# Patient Record
Sex: Female | Born: 1975 | Race: Black or African American | Hispanic: No | Marital: Married | Smoking: Never smoker
Health system: Southern US, Community
[De-identification: ages and names within clinical notes are randomized; demographics above are authoritative.]

## PROBLEM LIST (undated history)

## (undated) ENCOUNTER — Inpatient Hospital Stay (HOSPITAL_COMMUNITY): Payer: Self-pay

## (undated) DIAGNOSIS — Z789 Other specified health status: Secondary | ICD-10-CM

## (undated) DIAGNOSIS — Z289 Immunization not carried out for unspecified reason: Secondary | ICD-10-CM

## (undated) HISTORY — PX: WISDOM TOOTH EXTRACTION: SHX21

---
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
## 2009-05-05 ENCOUNTER — Inpatient Hospital Stay (HOSPITAL_COMMUNITY): Admission: AD | Admit: 2009-05-05 | Discharge: 2009-05-05 | Payer: Self-pay | Admitting: Obstetrics and Gynecology

## 2009-05-08 ENCOUNTER — Inpatient Hospital Stay (HOSPITAL_COMMUNITY): Admission: AD | Admit: 2009-05-08 | Discharge: 2009-05-11 | Payer: Self-pay | Admitting: Obstetrics and Gynecology

## 2009-05-08 ENCOUNTER — Ambulatory Visit: Payer: Self-pay | Admitting: Advanced Practice Midwife

## 2009-05-12 ENCOUNTER — Ambulatory Visit: Admission: RE | Admit: 2009-05-12 | Discharge: 2009-05-12 | Payer: Self-pay | Admitting: Obstetrics and Gynecology

## 2009-05-13 ENCOUNTER — Ambulatory Visit: Admission: RE | Admit: 2009-05-13 | Discharge: 2009-05-13 | Payer: Self-pay | Admitting: Obstetrics and Gynecology

## 2011-03-28 LAB — CBC
HCT: 23.4 % — ABNORMAL LOW (ref 36.0–46.0)
HCT: 33.3 % — ABNORMAL LOW (ref 36.0–46.0)
Hemoglobin: 8.2 g/dL — ABNORMAL LOW (ref 12.0–15.0)
MCHC: 34.9 g/dL (ref 30.0–36.0)
MCV: 98.1 fL (ref 78.0–100.0)
MCV: 99 fL (ref 78.0–100.0)
RBC: 2.37 MIL/uL — ABNORMAL LOW (ref 3.87–5.11)
RBC: 3.4 MIL/uL — ABNORMAL LOW (ref 3.87–5.11)
WBC: 8.3 10*3/uL (ref 4.0–10.5)

## 2011-03-31 ENCOUNTER — Inpatient Hospital Stay (HOSPITAL_COMMUNITY): Admission: AD | Admit: 2011-03-31 | Payer: Self-pay | Source: Home / Self Care | Admitting: Obstetrics and Gynecology

## 2011-04-06 ENCOUNTER — Inpatient Hospital Stay (HOSPITAL_COMMUNITY)
Admission: RE | Admit: 2011-04-06 | Discharge: 2011-04-08 | DRG: 373 | Disposition: A | Discharge status: Home / Self Care | Payer: BC Managed Care – PPO | Source: Ambulatory Visit | Attending: Obstetrics and Gynecology | Admitting: Obstetrics and Gynecology

## 2011-04-06 LAB — CBC
MCH: 32.4 pg (ref 26.0–34.0)
MCV: 95.5 fL (ref 78.0–100.0)
Platelets: 164 10*3/uL (ref 150–400)
RDW: 13.3 % (ref 11.5–15.5)

## 2011-04-07 LAB — CBC
Hemoglobin: 11 g/dL — ABNORMAL LOW (ref 12.0–15.0)
MCH: 32.5 pg (ref 26.0–34.0)
MCHC: 34.6 g/dL (ref 30.0–36.0)
Platelets: 143 10*3/uL — ABNORMAL LOW (ref 150–400)

## 2011-06-11 NOTE — H&P (Signed)
  NAME:  Jasmine Bowen, Jasmine Bowen                ACCOUNT NO.:  [id number]  MEDICAL RECORD NO.:  [id number]  LOCATION:  9121                          FACILITY:  [facility]  PHYSICIAN:  Kimika Streater L. Media Pizzini, M.D.DATE OF BIRTH:  11-25-1976  DATE OF ADMISSION:  04/06/2011 DATE OF DISCHARGE:  04/08/2011                             HISTORY & PHYSICAL   HISTORY OF PRESENT ILLNESS:  This patient is a 35 year old G2, P1 at 20 and 6, who was admitted for post-date induction per Dr. Renaldo Fiddler.  She has an uncomplicated prenatal history.  Prenatal care __________ Catha Gosselin.  PHYSICAL EXAMINATION:  VITAL SIGNS:  She is afebrile with stable vital signs.  Fetal heart rate is reactive and reassuring. GENERAL:  Alert and oriented, no apparent distress. LUNGS:  Clear to auscultation bilaterally. CARDIAC:  Regular rate and rhythm. BREASTS:  Soft, nontender, no masses. __________ She is about 7-1/2 pounds. LUNGS AND HEART:  Within normal limits. CERVIX:  See nursing notes because I cannot find it in the chart.  IMPRESSION:  Intrauterine pregnancy at 74 and 6, post dates.  PLAN:  Admit for induction per Dr. Renaldo Fiddler.     Callaway Hailes L. Vincente Poli, M.D.     Florestine Avers  D:  06/09/2011  T:  06/10/2011  Job:  [id number]  Electronically Signed by Marcelle Overlie M.D. on 06/11/2011 08:46:16 AM

## 2013-08-01 LAB — OB RESULTS CONSOLE ABO/RH: RH TYPE: POSITIVE

## 2013-08-01 LAB — OB RESULTS CONSOLE GC/CHLAMYDIA
CHLAMYDIA, DNA PROBE: NEGATIVE
GC PROBE AMP, GENITAL: NEGATIVE

## 2013-08-01 LAB — OB RESULTS CONSOLE RPR: RPR: NONREACTIVE

## 2013-08-01 LAB — OB RESULTS CONSOLE HEPATITIS B SURFACE ANTIGEN: HEP B S AG: NEGATIVE

## 2013-08-01 LAB — OB RESULTS CONSOLE ANTIBODY SCREEN: Antibody Screen: NEGATIVE

## 2013-08-01 LAB — OB RESULTS CONSOLE RUBELLA ANTIBODY, IGM: Rubella: IMMUNE

## 2013-12-18 NOTE — L&D Delivery Note (Signed)
Delivery Note At 11:24 AM a viable female was delivered via Vaginal, Spontaneous Delivery (Presentation: Left Occiput Anterior).  APGAR: 8, 9; weight .   Placenta status: Intact, Spontaneous.  Cord: 3 vessels with the following complications: .  Cord pH: not sent  Anesthesia: Epidural  Episiotomy: None Lacerations: 2nd degree Suture Repair: 3.0 vicryl rapide Est. Blood Loss (mL): 300  Mom to postpartum.  Baby to Nursery.  Lizbet Cirrincione M 03/13/2014, 12:01 PM

## 2013-12-29 ENCOUNTER — Inpatient Hospital Stay (HOSPITAL_COMMUNITY)
Admission: AD | Admit: 2013-12-29 | Discharge: 2013-12-29 | Disposition: A | Discharge status: Home / Self Care | Payer: BC Managed Care – PPO | Source: Ambulatory Visit | Attending: Obstetrics and Gynecology | Admitting: Obstetrics and Gynecology

## 2013-12-29 ENCOUNTER — Encounter (HOSPITAL_COMMUNITY): Payer: Self-pay | Admitting: *Deleted

## 2013-12-29 DIAGNOSIS — Z3689 Encounter for other specified antenatal screening: Secondary | ICD-10-CM

## 2013-12-29 DIAGNOSIS — O368390 Maternal care for abnormalities of the fetal heart rate or rhythm, unspecified trimester, not applicable or unspecified: Secondary | ICD-10-CM | POA: Insufficient documentation

## 2013-12-29 HISTORY — DX: Other specified health status: Z78.9

## 2013-12-29 NOTE — MAU Provider Note (Signed)
  History     CSN: [id number]  Arrival date and time: 12/29/13 1656   First Provider Initiated Contact with Patient 12/29/13 1823      Chief Complaint  Patient presents with  . fetal monitoring    HPI  Ms. Jasmine Bowen is a 38 y.o. female G3P2002 at 7348w5d who presents for an NST. She was seen at her prenatal visit today and they thought they heard an arrythmia on fetal doppler and sent her here for further monitoring. She reports good fetal movement, denies LOF, vaginal bleeding, vaginal itching/burning, urinary symptoms, h/a, dizziness, n/v, or fever/chills.    OB History   Grav Para Term Preterm Abortions TAB SAB Ect Mult Living   3 2 2       2       Past Medical History  Diagnosis Date  . Medical history non-contributory     Past Surgical History  Procedure Laterality Date  . Wisdom tooth extraction      History reviewed. No pertinent family history.  History  Substance Use Topics  . Smoking status: Never Smoker   . Smokeless tobacco: Not on file  . Alcohol Use: No    Allergies: No Known Allergies  Prescriptions prior to admission  Medication Sig Dispense Refill  . Fe Fum-FePoly-FA-Vit C-Vit B3 (INTEGRA F) 125-1 MG CAPS Take 1 tablet by mouth at bedtime.      . Prenatal Vit-Fe Fumarate-FA (PRENATAL MULTIVITAMIN) TABS tablet Take 1 tablet by mouth daily at 12 noon.        Review of Systems  Constitutional: Negative for fever and chills.  Gastrointestinal: Negative for heartburn, nausea, vomiting, abdominal pain, diarrhea and constipation.  Genitourinary: Negative for dysuria, urgency, frequency and hematuria.       No vaginal discharge. No vaginal bleeding. No dysuria.    Physical Exam   Blood pressure 115/63, pulse 93, temperature 98.5 F (36.9 C), temperature source Oral, resp. rate 18, height 5\' 7"  (1.702 m), weight 69.854 kg (154 lb).  Physical Exam  Constitutional: She is oriented to person, place, and time. She appears well-developed and  well-nourished. No distress.  HENT:  Head: Normocephalic.  Eyes: Conjunctivae are normal.  Neck: Neck supple.  Musculoskeletal: Normal range of motion.  Neurological: She is alert and oriented to person, place, and time.  Skin: Skin is warm. She is not diaphoretic.  Psychiatric: Her behavior is normal.    Fetal Tracing: Baseline: 140 bpm with prolonged acceleration  Variability: moderate  Accelerations: 15x15 Decelerations: none Toco: None  MAU Course  Procedures None  MDM NST  Assessment and Plan   A:  1. NST (non-stress test) reactive on fetal surveillance     P:  Discharge home Kick counts discussed The office will call you this week to schedule a follow up appt.  Return to MAU for further concern   Iona HansenJennifer Irene Rasch, NP 12/29/2013, 7:18 PM

## 2013-12-29 NOTE — MAU Note (Signed)
Pt sent from MD office for monitoring, ? Arrythmia heard with doppler.  Pt denies pain, bleeding or LOF.

## 2014-03-12 ENCOUNTER — Inpatient Hospital Stay (HOSPITAL_COMMUNITY)
Admission: AD | Admit: 2014-03-12 | Discharge: 2014-03-15 | DRG: 775 | Disposition: A | Discharge status: Home / Self Care | Payer: BC Managed Care – PPO | Source: Ambulatory Visit | Attending: Obstetrics and Gynecology | Admitting: Obstetrics and Gynecology

## 2014-03-12 ENCOUNTER — Encounter (HOSPITAL_COMMUNITY): Payer: Self-pay | Admitting: *Deleted

## 2014-03-12 DIAGNOSIS — O99892 Other specified diseases and conditions complicating childbirth: Secondary | ICD-10-CM | POA: Diagnosis present

## 2014-03-12 DIAGNOSIS — O9989 Other specified diseases and conditions complicating pregnancy, childbirth and the puerperium: Secondary | ICD-10-CM

## 2014-03-12 DIAGNOSIS — IMO0001 Reserved for inherently not codable concepts without codable children: Secondary | ICD-10-CM

## 2014-03-12 DIAGNOSIS — Z22330 Carrier of Group B streptococcus: Secondary | ICD-10-CM

## 2014-03-12 DIAGNOSIS — O09529 Supervision of elderly multigravida, unspecified trimester: Secondary | ICD-10-CM | POA: Diagnosis present

## 2014-03-12 LAB — CBC
HCT: 33.3 % — ABNORMAL LOW (ref 36.0–46.0)
HEMOGLOBIN: 12 g/dL (ref 12.0–15.0)
MCH: 33.1 pg (ref 26.0–34.0)
MCHC: 36 g/dL (ref 30.0–36.0)
MCV: 92 fL (ref 78.0–100.0)
Platelets: 157 10*3/uL (ref 150–400)
RBC: 3.62 MIL/uL — AB (ref 3.87–5.11)
RDW: 13.1 % (ref 11.5–15.5)
WBC: 6.5 10*3/uL (ref 4.0–10.5)

## 2014-03-12 LAB — OB RESULTS CONSOLE GBS: GBS: POSITIVE

## 2014-03-12 LAB — OB RESULTS CONSOLE HIV ANTIBODY (ROUTINE TESTING): HIV: NONREACTIVE

## 2014-03-12 LAB — RAPID HIV SCREEN (WH-MAU): Rapid HIV Screen: NONREACTIVE

## 2014-03-12 MED ORDER — LIDOCAINE HCL (PF) 1 % IJ SOLN
30.0000 mL | INTRAMUSCULAR | Status: DC | PRN
  Filled 2014-03-12: qty 30

## 2014-03-12 MED ORDER — PENICILLIN G POTASSIUM 5000000 UNITS IJ SOLR
2.5000 10*6.[IU] | INTRAVENOUS | Status: DC
  Administered 2014-03-13 (×2): 2.5 10*6.[IU] via INTRAVENOUS
  Filled 2014-03-12 (×7): qty 2.5

## 2014-03-12 MED ORDER — LACTATED RINGERS IV SOLN
INTRAVENOUS | Status: DC
  Administered 2014-03-12 – 2014-03-13 (×3): via INTRAVENOUS

## 2014-03-12 MED ORDER — FENTANYL 2.5 MCG/ML BUPIVACAINE 1/10 % EPIDURAL INFUSION (WH - ANES)
14.0000 mL/h | INTRAMUSCULAR | Status: DC | PRN
  Administered 2014-03-13 (×2): 14 mL/h via EPIDURAL
  Filled 2014-03-12 (×2): qty 125

## 2014-03-12 MED ORDER — OXYTOCIN 40 UNITS IN LACTATED RINGERS INFUSION - SIMPLE MED
62.5000 mL/h | INTRAVENOUS | Status: DC
  Filled 2014-03-12: qty 1000

## 2014-03-12 MED ORDER — IBUPROFEN 600 MG PO TABS: 600.0000 mg | ORAL_TABLET | Freq: Four times a day (QID) | ORAL | Status: DC | PRN

## 2014-03-12 MED ORDER — ONDANSETRON HCL 4 MG/2ML IJ SOLN: 4.0000 mg | Freq: Four times a day (QID) | INTRAMUSCULAR | Status: DC | PRN

## 2014-03-12 MED ORDER — PHENYLEPHRINE 40 MCG/ML (10ML) SYRINGE FOR IV PUSH (FOR BLOOD PRESSURE SUPPORT)
80.0000 ug | PREFILLED_SYRINGE | INTRAVENOUS | Status: DC | PRN
  Filled 2014-03-12: qty 10
  Filled 2014-03-12: qty 2

## 2014-03-12 MED ORDER — EPHEDRINE 5 MG/ML INJ
10.0000 mg | INTRAVENOUS | Status: DC | PRN
  Filled 2014-03-12: qty 2
  Filled 2014-03-12: qty 4

## 2014-03-12 MED ORDER — OXYCODONE-ACETAMINOPHEN 5-325 MG PO TABS: 1.0000 | ORAL_TABLET | ORAL | Status: DC | PRN

## 2014-03-12 MED ORDER — OXYTOCIN BOLUS FROM INFUSION
500.0000 mL | INTRAVENOUS | Status: DC
  Administered 2014-03-13: 500 mL via INTRAVENOUS

## 2014-03-12 MED ORDER — DIPHENHYDRAMINE HCL 50 MG/ML IJ SOLN: 12.5000 mg | INTRAMUSCULAR | Status: DC | PRN

## 2014-03-12 MED ORDER — LACTATED RINGERS IV SOLN: 500.0000 mL | Freq: Once | INTRAVENOUS | Status: DC

## 2014-03-12 MED ORDER — ACETAMINOPHEN 325 MG PO TABS: 650.0000 mg | ORAL_TABLET | ORAL | Status: DC | PRN

## 2014-03-12 MED ORDER — EPHEDRINE 5 MG/ML INJ
10.0000 mg | INTRAVENOUS | Status: DC | PRN
  Filled 2014-03-12: qty 2

## 2014-03-12 MED ORDER — PENICILLIN G POTASSIUM 5000000 UNITS IJ SOLR
5.0000 10*6.[IU] | Freq: Once | INTRAVENOUS | Status: AC
  Administered 2014-03-12: 5 10*6.[IU] via INTRAVENOUS
  Filled 2014-03-12: qty 5

## 2014-03-12 MED ORDER — PHENYLEPHRINE 40 MCG/ML (10ML) SYRINGE FOR IV PUSH (FOR BLOOD PRESSURE SUPPORT)
80.0000 ug | PREFILLED_SYRINGE | INTRAVENOUS | Status: DC | PRN
  Filled 2014-03-12: qty 2

## 2014-03-12 MED ORDER — CITRIC ACID-SODIUM CITRATE 334-500 MG/5ML PO SOLN: 30.0000 mL | ORAL | Status: DC | PRN

## 2014-03-12 MED ORDER — FLEET ENEMA 7-19 GM/118ML RE ENEM: 1.0000 | ENEMA | RECTAL | Status: DC | PRN

## 2014-03-12 MED ORDER — LACTATED RINGERS IV SOLN: 500.0000 mL | INTRAVENOUS | Status: DC | PRN

## 2014-03-12 NOTE — MAU Note (Signed)
Contractions every 5 minutes. Denies VB or LOF. Positive fetal movement. Was dilated 3-4 cm in office.

## 2014-03-13 ENCOUNTER — Encounter (HOSPITAL_COMMUNITY): Payer: Self-pay | Admitting: *Deleted

## 2014-03-13 ENCOUNTER — Encounter (HOSPITAL_COMMUNITY): Payer: BC Managed Care – PPO | Admitting: Anesthesiology

## 2014-03-13 ENCOUNTER — Inpatient Hospital Stay (HOSPITAL_COMMUNITY): Payer: BC Managed Care – PPO | Admitting: Anesthesiology

## 2014-03-13 LAB — RPR: RPR Ser Ql: NONREACTIVE

## 2014-03-13 MED ORDER — BENZOCAINE-MENTHOL 20-0.5 % EX AERO
1.0000 "application " | INHALATION_SPRAY | CUTANEOUS | Status: DC | PRN
  Administered 2014-03-13: 1 via TOPICAL
  Filled 2014-03-13: qty 56

## 2014-03-13 MED ORDER — BISACODYL 10 MG RE SUPP: 10.0000 mg | Freq: Every day | RECTAL | Status: DC | PRN

## 2014-03-13 MED ORDER — DIPHENHYDRAMINE HCL 25 MG PO CAPS: 25.0000 mg | ORAL_CAPSULE | Freq: Four times a day (QID) | ORAL | Status: DC | PRN

## 2014-03-13 MED ORDER — IBUPROFEN 800 MG PO TABS
800.0000 mg | ORAL_TABLET | Freq: Three times a day (TID) | ORAL | Status: DC | PRN
  Administered 2014-03-13 – 2014-03-15 (×5): 800 mg via ORAL
  Filled 2014-03-13 (×5): qty 1

## 2014-03-13 MED ORDER — OXYCODONE-ACETAMINOPHEN 5-325 MG PO TABS: 1.0000 | ORAL_TABLET | Freq: Four times a day (QID) | ORAL | Status: DC | PRN

## 2014-03-13 MED ORDER — ONDANSETRON HCL 4 MG/2ML IJ SOLN: 4.0000 mg | INTRAMUSCULAR | Status: DC | PRN

## 2014-03-13 MED ORDER — ZOLPIDEM TARTRATE 5 MG PO TABS: 5.0000 mg | ORAL_TABLET | Freq: Every evening | ORAL | Status: DC | PRN

## 2014-03-13 MED ORDER — LIDOCAINE HCL (PF) 1 % IJ SOLN
INTRAMUSCULAR | Status: DC | PRN
  Administered 2014-03-13 (×4): 4 mL

## 2014-03-13 MED ORDER — TETANUS-DIPHTH-ACELL PERTUSSIS 5-2.5-18.5 LF-MCG/0.5 IM SUSP: 0.5000 mL | Freq: Once | INTRAMUSCULAR | Status: DC

## 2014-03-13 MED ORDER — FLEET ENEMA 7-19 GM/118ML RE ENEM: 1.0000 | ENEMA | Freq: Every day | RECTAL | Status: DC | PRN

## 2014-03-13 MED ORDER — ONDANSETRON HCL 4 MG PO TABS: 4.0000 mg | ORAL_TABLET | ORAL | Status: DC | PRN

## 2014-03-13 MED ORDER — LANOLIN HYDROUS EX OINT: TOPICAL_OINTMENT | CUTANEOUS | Status: DC | PRN

## 2014-03-13 MED ORDER — WITCH HAZEL-GLYCERIN EX PADS: 1.0000 "application " | MEDICATED_PAD | CUTANEOUS | Status: DC | PRN

## 2014-03-13 MED ORDER — MEASLES, MUMPS & RUBELLA VAC ~~LOC~~ INJ: 0.5000 mL | INJECTION | Freq: Once | SUBCUTANEOUS | Status: DC

## 2014-03-13 MED ORDER — DIBUCAINE 1 % RE OINT: 1.0000 "application " | TOPICAL_OINTMENT | RECTAL | Status: DC | PRN

## 2014-03-13 MED ORDER — PRENATAL MULTIVITAMIN CH
1.0000 | ORAL_TABLET | Freq: Every day | ORAL | Status: DC
  Administered 2014-03-13 – 2014-03-14 (×2): 1 via ORAL
  Filled 2014-03-13 (×2): qty 1

## 2014-03-13 MED ORDER — SENNOSIDES-DOCUSATE SODIUM 8.6-50 MG PO TABS
2.0000 | ORAL_TABLET | ORAL | Status: DC
  Administered 2014-03-14 (×2): 2 via ORAL
  Filled 2014-03-13 (×2): qty 2

## 2014-03-13 MED ORDER — SIMETHICONE 80 MG PO CHEW: 80.0000 mg | CHEWABLE_TABLET | ORAL | Status: DC | PRN

## 2014-03-13 NOTE — Anesthesia Postprocedure Evaluation (Signed)
  Anesthesia Post-op Note  Patient: Jasmine RackJerilyn S Merkel  Procedure(s) Performed: * No procedures listed *  Patient Location: mother baby  Anesthesia Type:Epidural  Level of Consciousness: awake, alert  and oriented  Airway and Oxygen Therapy: Patient Spontanous Breathing  Post-op Pain: none  Post-op Assessment: Post-op Vital signs reviewed, Patient's Cardiovascular Status Stable, Respiratory Function Stable, No headache, No backache, No residual numbness and No residual motor weakness  Post-op Vital Signs: Reviewed and stable  Complications: No apparent anesthesia complications

## 2014-03-13 NOTE — H&P (Signed)
Jasmine Bowen is a 38 y.o. female presenting at 6740.2 with onset of labor.  AMA declined screening.  postive GBS Maternal Medical History:  Reason for admission: Contractions.   Contractions: Onset was 1-2 hours ago.   Frequency: regular.   Perceived severity is moderate.    Fetal activity: Perceived fetal activity is normal.    Prenatal complications: no prenatal complications Prenatal Complications - Diabetes: none.    OB History   Grav Para Term Preterm Abortions TAB SAB Ect Mult Living   3 2 2       2      Past Medical History  Diagnosis Date  . Medical history non-contributory    Past Surgical History  Procedure Laterality Date  . Wisdom tooth extraction     Family History: family history is not on file. Social History:  reports that she has never smoked. She does not have any smokeless tobacco history on file. She reports that she does not drink alcohol or use illicit drugs.   Prenatal Transfer Tool  Maternal Diabetes: No Genetic Screening: Declined Maternal Ultrasounds/Referrals: Normal Fetal Ultrasounds or other Referrals:  None Maternal Substance Abuse:  No Significant Maternal Medications:  None Significant Maternal Lab Results:  Lab values include: Group B Strep positive Other Comments:  None  ROS  Dilation: 7 Effacement (%): 60;70 Station: 0 Exam by:: LCarpenter,RN Blood pressure 109/81, pulse 80, temperature 98.6 F (37 C), temperature source Oral, resp. rate 18, height 5\' 7"  (1.702 m), weight 73.211 kg (161 lb 6.4 oz), SpO2 99.00%. Maternal Exam:  Uterine Assessment: Contraction strength is moderate.  Contraction frequency is regular.   Abdomen: Patient reports no abdominal tenderness. Fundal height is c/w dates.   Estimated fetal weight is 7.   Fetal presentation: vertex  Introitus: Amniotic fluid character: clear.  Pelvis: adequate for delivery.   Cervix: 7 cm and 100 %   Fetal Exam Fetal State Assessment: Category I - tracings are  normal.     Physical Exam  Prenatal labs: ABO, Rh: B/Positive/-- (08/15 0000) Antibody: Negative (08/15 0000) Rubella: Immune (08/15 0000) RPR: NON REACTIVE (03/26 2300)  HBsAg: Negative (08/15 0000)  HIV: Non-reactive (03/26 0000)  GBS: Positive (03/26 0000)   Assessment/Plan: IUP at 40.2 with sol Routine labor and delivery Penicillin for GBS   Jasmine Bowen 03/13/2014, 6:25 AM

## 2014-03-13 NOTE — Lactation Note (Signed)
This note was copied from the chart of Jasmine Lucinda Stopper. Lactation Consultation Note Initial visit at 12 hours of age.  Mom is finishing a feeding with a great latch.  Mom is experienced and denies problems or concerns.  [facility] given and discussed.  Baby has latched several times and mom is confident.  Follow up PRN.  Patient Name: Jasmine Bowen ZOXWR'UToday's Date: 03/13/2014 Reason for consult: Initial assessment   Maternal Data Has patient been taught Hand Expression?: Yes Does the patient have breastfeeding experience prior to this delivery?: Yes  Feeding Feeding Type: Breast Fed Length of feed:  (few minutes observed)  LATCH Score/Interventions Latch: Grasps breast easily, tongue down, lips flanged, rhythmical sucking.  Audible Swallowing: A few with stimulation (per mom)  Type of Nipple: Everted at rest and after stimulation  Comfort (Breast/Nipple): Soft / non-tender     Hold (Positioning): No assistance needed to correctly position infant at breast.  LATCH Score: 9  Lactation Tools Discussed/Used     Consult Status Consult Status: Follow-up Date: 03/14/14 Follow-up type: In-patient    Beverely RisenShoptaw, Arvella MerlesJana Lynn 03/13/2014, 11:36 PM

## 2014-03-13 NOTE — Anesthesia Preprocedure Evaluation (Signed)

## 2014-03-13 NOTE — Anesthesia Procedure Notes (Signed)
Epidural Patient location during procedure: OB Start time: 03/13/2014 12:23 AM  Staffing Performed by: anesthesiologist   Preanesthetic Checklist Completed: patient identified, site marked, surgical consent, pre-op evaluation, timeout performed, IV checked, risks and benefits discussed and monitors and equipment checked  Epidural Patient position: sitting Prep: site prepped and draped and DuraPrep Patient monitoring: continuous pulse ox and blood pressure Approach: midline Injection technique: LOR air  Needle:  Needle type: Tuohy  Needle gauge: 17 G Needle length: 9 cm and 9 Needle insertion depth: 5 cm cm Catheter type: closed end flexible Catheter size: 19 Gauge Catheter at skin depth: 10 cm Test dose: negative  Assessment Events: blood not aspirated, injection not painful, no injection resistance, negative IV test and no paresthesia  Additional Notes Discussed risk of headache, infection, bleeding, nerve injury and failed or incomplete block.  Patient voices understanding and wishes to proceed.  Epidural placed easily on first attempt.  No paresthesia.  Patient tolerated procedure well with no apparent complications.  Jasmine DecemberA. Calab Sachse, MDReason for block:procedure for pain

## 2014-03-14 LAB — CBC
HEMATOCRIT: 26.2 % — AB (ref 36.0–46.0)
HEMOGLOBIN: 9.4 g/dL — AB (ref 12.0–15.0)
MCH: 32.9 pg (ref 26.0–34.0)
MCHC: 35.5 g/dL (ref 30.0–36.0)
MCV: 92.6 fL (ref 78.0–100.0)
Platelets: 131 10*3/uL — ABNORMAL LOW (ref 150–400)
RBC: 2.83 MIL/uL — ABNORMAL LOW (ref 3.87–5.11)
RDW: 13.2 % (ref 11.5–15.5)
WBC: 8.2 10*3/uL (ref 4.0–10.5)

## 2014-03-14 NOTE — Progress Notes (Signed)
Post Partum Day 1 Subjective: no complaints  Objective: Blood pressure 126/67, pulse 77, temperature 98.1 F (36.7 C), temperature source Oral, resp. rate 20, height 5\' 7"  (1.702 m), weight 161 lb 6.4 oz (73.211 kg), SpO2 99.00%, unknown if currently breastfeeding.  Physical Exam:  General: alert Lochia: appropriate Uterine Fundus: firm Incision: healing well DVT Evaluation: No evidence of DVT seen on physical exam.   Recent Labs  03/12/14 2300 03/14/14 0547  HGB 12.0 9.4*  HCT 33.3* 26.2*    Assessment/Plan: Plan for discharge tomorrow   LOS: 2 days   Airiana Elman M 03/14/2014, 7:35 AM

## 2014-03-15 MED ORDER — IBUPROFEN 800 MG PO TABS: 800.0000 mg | ORAL_TABLET | Freq: Three times a day (TID) | ORAL | Status: DC | PRN

## 2014-03-15 NOTE — Discharge Summary (Signed)
Obstetric Discharge Summary Reason for Admission: onset of labor Prenatal Procedures: none Intrapartum Procedures: spontaneous vaginal delivery Postpartum Procedures: none Complications-Operative and Postpartum: none Hemoglobin  Date Value Ref Range Status  03/14/2014 9.4* 12.0 - 15.0 g/dL Final     REPEATED TO VERIFY     DELTA CHECK NOTED     HCT  Date Value Ref Range Status  03/14/2014 26.2* 36.0 - 46.0 % Final    Physical Exam:  General: alert Lochia: appropriate Uterine Fundus: firm Incision: healing well DVT Evaluation: No evidence of DVT seen on physical exam.  Discharge Diagnoses: Term Pregnancy-delivered  Discharge Information: Date: 03/15/2014 Activity: pelvic rest Diet: routine Medications: PNV and Ibuprofen Condition: stable Instructions: refer to practice specific booklet Discharge to: home Follow-up Information   [facility] Schedule an appointment as soon as possible for a visit in 6 weeks.   Contact information:   [address] [place] [id number] [phone]      Newborn Data: Live born female  Birth Weight: 8 lb 3.4 oz (3725 g) APGAR: 8, 9  Home with mother.  Meriel PicaHOLLAND,Abhi Moccia M 03/15/2014, 8:34 AM

## 2014-03-18 ENCOUNTER — Inpatient Hospital Stay (HOSPITAL_COMMUNITY): Admission: RE | Admit: 2014-03-18 | Payer: BC Managed Care – PPO | Source: Ambulatory Visit

## 2014-10-19 ENCOUNTER — Encounter (HOSPITAL_COMMUNITY): Payer: Self-pay | Admitting: *Deleted

## 2016-06-14 ENCOUNTER — Ambulatory Visit (HOSPITAL_COMMUNITY): Payer: BC Managed Care – PPO

## 2016-06-14 ENCOUNTER — Ambulatory Visit (HOSPITAL_COMMUNITY)
Admission: EM | Admit: 2016-06-14 | Discharge: 2016-06-14 | Disposition: A | Discharge status: Home / Self Care | Payer: BC Managed Care – PPO | Attending: Family Medicine | Admitting: Family Medicine

## 2016-06-14 ENCOUNTER — Encounter (HOSPITAL_COMMUNITY): Payer: Self-pay | Admitting: Emergency Medicine

## 2016-06-14 DIAGNOSIS — S62231A Other displaced fracture of base of first metacarpal bone, right hand, initial encounter for closed fracture: Secondary | ICD-10-CM

## 2016-06-14 DIAGNOSIS — S61011A Laceration without foreign body of right thumb without damage to nail, initial encounter: Secondary | ICD-10-CM | POA: Diagnosis not present

## 2016-06-14 DIAGNOSIS — W230XXA Caught, crushed, jammed, or pinched between moving objects, initial encounter: Secondary | ICD-10-CM | POA: Insufficient documentation

## 2016-06-14 DIAGNOSIS — S60011A Contusion of right thumb without damage to nail, initial encounter: Secondary | ICD-10-CM | POA: Diagnosis not present

## 2016-06-14 DIAGNOSIS — Z9889 Other specified postprocedural states: Secondary | ICD-10-CM | POA: Insufficient documentation

## 2016-06-14 DIAGNOSIS — S62501A Fracture of unspecified phalanx of right thumb, initial encounter for closed fracture: Secondary | ICD-10-CM | POA: Insufficient documentation

## 2016-06-14 DIAGNOSIS — Z79899 Other long term (current) drug therapy: Secondary | ICD-10-CM | POA: Insufficient documentation

## 2016-06-14 MED ORDER — HYDROCODONE-ACETAMINOPHEN 5-325 MG PO TABS: 1.0000 | ORAL_TABLET | ORAL | Status: DC | PRN

## 2016-06-14 NOTE — ED Notes (Signed)
Cleansing, splinting performed by livia sneed, emt

## 2016-06-14 NOTE — ED Notes (Signed)
[facility]

## 2016-06-14 NOTE — ED Notes (Signed)
This nurse not notified of vital sign

## 2016-06-14 NOTE — ED Provider Notes (Signed)
CSN: [id number]     Arrival date & time 06/14/16  1429 History   First MD Initiated Contact with Patient 06/14/16 1522     Chief Complaint  Patient presents with  . Hand Injury   (Consider location/radiation/quality/duration/timing/severity/associated sxs/prior Treatment) HPI Comments: 40 year old female presents to urgent care with complaints of pain to the right thumb after she had it slammed in a car door today around 2 PM. The door slammed across the distal aspect of the thumb at the base of the nail. There is an associated superficial laceration at the base of the nail. No tenderness or pain or swelling to the DIP joint. Tea tap administered 12/10/2013  Patient is a 40 y.o. female presenting with URI.  URI Presenting symptoms: no fever     Past Medical History  Diagnosis Date  . Medical history non-contributory    Past Surgical History  Procedure Laterality Date  . Wisdom tooth extraction     Family History  Problem Relation Age of Onset  . Diabetes Father    Social History  Substance Use Topics  . Smoking status: Never Smoker   . Smokeless tobacco: None  . Alcohol Use: No   OB History    Gravida Para Term Preterm AB TAB SAB Ectopic Multiple Living   3 3 3       3      Review of Systems  Constitutional: Negative for fever, chills and activity change.  Respiratory: Negative.   Musculoskeletal:       As per HPI  Skin: Negative for color change, pallor and rash.  Neurological: Negative.   All other systems reviewed and are negative.   Allergies  Review of patient's allergies indicates no known allergies.  Home Medications   Prior to Admission medications   Medication Sig Start Date End Date Taking? Authorizing Provider  HYDROcodone-acetaminophen (NORCO/VICODIN) 5-325 MG tablet Take 1 tablet by mouth every 4 (four) hours as needed. 06/14/16   Hayden Rasmussenavid Hamzeh Tall, NP  ibuprofen (ADVIL,MOTRIN) 800 MG tablet Take 1 tablet (800 mg total) by mouth every 8 (eight) hours as  needed for moderate pain. 03/15/14   Richarda Overlieichard Holland, MD  Prenatal Vit-Fe Fumarate-FA (PRENATAL MULTIVITAMIN) TABS tablet Take 1 tablet by mouth daily at 12 noon.    Historical Provider, MD   Meds Ordered and Administered this Visit  Medications - No data to display  BP 109/51 mmHg  Pulse 74  Temp(Src) 98.2 F (36.8 C) (Oral)  Resp 12  SpO2 100%  LMP 06/09/2016 No data found.   Physical Exam  Constitutional: She is oriented to person, place, and time. She appears well-developed and well-nourished. No distress.  HENT:  Head: Normocephalic and atraumatic.  Eyes: EOM are normal.  Neck: Normal range of motion.  Cardiovascular: Normal rate.   Pulmonary/Chest: Effort normal.  Musculoskeletal:  Pain and tenderness to the distal right thumb and over the nail. She complains of numbness and decreased feeling over the nailbed. The nail is currently intact. There is a small laceration at the base of the nail over the extensor surface. Note tenderness, deformity over the IP joint. Full range of motion of the IP joint.  Neurological: She is alert and oriented to person, place, and time. No cranial nerve deficit.  Skin: Skin is warm and dry.  Psychiatric: She has a normal mood and affect.    ED Course  Procedures (including critical care time)  Labs Review Labs Reviewed - No data to display  Imaging Review Dg Finger Thumb  Right  06/14/2016  CLINICAL DATA:  Closed right thumb in van door EXAM: RIGHT THUMB 2+V COMPARISON:  None. FINDINGS: There is a nondisplaced fracture through the right thumb distal phalanx. No intraarticular extension. No subluxation or dislocation. Soft tissues are intact. IMPRESSION: Nondisplaced fracture through the distal phalanx of the right thumb. Electronically Signed   By: Charlett NoseKevin  Dover M.D.   On: 06/14/2016 15:53     Visual Acuity Review  Right Eye Distance:   Left Eye Distance:   Bilateral Distance:    Right Eye Near:   Left Eye Near:    Bilateral Near:          MDM   1. Contusion, thumb, right, initial encounter   2. Fracture of thumb, base, right, closed, initial encounter   3. Laceration of thumb, right, initial encounter    Meds ordered this encounter  Medications  . HYDROcodone-acetaminophen (NORCO/VICODIN) 5-325 MG tablet    Sig: Take 1 tablet by mouth every 4 (four) hours as needed.    Dispense:  15 tablet    Refill:  0    Order Specific Question:  Supervising Provider    Answer:  Bradd CanaryKINDL, JAMES D [[id number]]   Wound cleaning procedure to the superficial, laceration. Bandaged. Apply splint to the thumb. Follow-up with orthopedist on call. Call tomorrow for an appointment. Patient does note that they are leaving early Friday morning out of town until July 5. Keep splint on. Watch for signs of infection. For any signs of infection need to seek medical attention promptly. This is not a appear to be an open fracture.    Hayden Rasmussenavid Cove Haydon, NP 06/14/16 1650  Hayden Rasmussenavid Miken Stecher, NP 06/14/16 1730

## 2016-06-14 NOTE — Discharge Instructions (Signed)
Cast or Splint Care Casts and splints support injured limbs and keep bones from moving while they heal.  HOME CARE  Keep the cast or splint uncovered during the drying period.  A plaster cast can take 24 to 48 hours to dry.  A fiberglass cast will dry in less than 1 hour.  Do not rest the cast on anything harder than a pillow for 24 hours.  Do not put weight on your injured limb. Do not put pressure on the cast. Wait for your doctor's approval.  Keep the cast or splint dry.  Cover the cast or splint with a plastic bag during baths or wet weather.  If you have a cast over your chest and belly (trunk), take sponge baths until the cast is taken off.  If your cast gets wet, dry it with a towel or blow dryer. Use the cool setting on the blow dryer.  Keep your cast or splint clean. Wash a dirty cast with a damp cloth.  Do not put any objects under your cast or splint.  Do not scratch the skin under the cast with an object. If itching is a problem, use a blow dryer on a cool setting over the itchy area.  Do not trim or cut your cast.  Do not take out the padding from inside your cast.  Exercise your joints near the cast as told by your doctor.  Raise (elevate) your injured limb on 1 or 2 pillows for the first 1 to 3 days. GET HELP IF:  Your cast or splint cracks.  Your cast or splint is too tight or too loose.  You itch badly under the cast.  Your cast gets wet or has a soft spot.  You have a bad smell coming from the cast.  You get an object stuck under the cast.  Your skin around the cast becomes red or sore.  You have new or more pain after the cast is put on. GET HELP RIGHT AWAY IF:  You have fluid leaking through the cast.  You cannot move your fingers or toes.  Your fingers or toes turn blue or white or are cool, painful, or puffy (swollen).  You have tingling or lose feeling (numbness) around the injured area.  You have bad pain or pressure under the  cast.  You have trouble breathing or have shortness of breath.  You have chest pain.   This information is not intended to replace advice given to you by your health care provider. Make sure you discuss any questions you have with your health care provider.   Document Released: 04/05/2011 Document Revised: 08/06/2013 Document Reviewed: 06/12/2013 Elsevier Interactive Patient Education 2016 [employer]  Contusion A contusion is a deep bruise. Contusions are the result of a blunt injury to tissues and muscle fibers under the skin. The injury causes bleeding under the skin. The skin overlying the contusion may turn blue, purple, or yellow. Minor injuries will give you a painless contusion, but more severe contusions may stay painful and swollen for a few weeks.  CAUSES  This condition is usually caused by a blow, trauma, or direct force to an area of the body. SYMPTOMS  Symptoms of this condition include:  Swelling of the injured area.  Pain and tenderness in the injured area.  Discoloration. The area may have redness and then turn blue, purple, or yellow. DIAGNOSIS  This condition is diagnosed based on a physical exam and medical history. An X-ray, CT scan,  or MRI may be needed to determine if there are any associated injuries, such as broken bones (fractures). TREATMENT  Specific treatment for this condition depends on what area of the body was injured. In general, the best treatment for a contusion is resting, icing, applying pressure to (compression), and elevating the injured area. This is often called the RICE strategy. Over-the-counter anti-inflammatory medicines may also be recommended for pain control.  HOME CARE INSTRUCTIONS   Rest the injured area.  If directed, apply ice to the injured area:  Put ice in a plastic bag.  Place a towel between your skin and the bag.  Leave the ice on for 20 minutes, 2-3 times per day.  If directed, apply light compression to the  injured area using an elastic bandage. Make sure the bandage is not wrapped too tightly. Remove and reapply the bandage as directed by your health care provider.  If possible, raise (elevate) the injured area above the level of your heart while you are sitting or lying down.  Take over-the-counter and prescription medicines only as told by your health care provider. SEEK MEDICAL CARE IF:  Your symptoms do not improve after several days of treatment.  Your symptoms get worse.  You have difficulty moving the injured area. SEEK IMMEDIATE MEDICAL CARE IF:   You have severe pain.  You have numbness in a hand or foot.  Your hand or foot turns pale or cold.   This information is not intended to replace advice given to you by your health care provider. Make sure you discuss any questions you have with your health care provider.   Document Released: 09/13/2005 Document Revised: 08/25/2015 Document Reviewed: 04/21/2015 Elsevier Interactive Patient Education 2016 [employer]  Nonsutured Laceration Care A laceration is a cut that goes through all layers of the skin and extends into the tissue that is right under the skin. This type of cut is usually stitched up (sutured) or closed with tape (adhesive strips) or skin glue shortly after the injury happens. However, if the wound is dirty or if several hours pass before medical treatment is provided, it is likely that germs (bacteria) will enter the wound. Closing a laceration after bacteria have entered it increases the risk of infection. In these cases, your health care provider may leave the laceration open (nonsutured) and cover it with a bandage. This type of treatment helps prevent infection and allows the wound to heal from the deepest layer of tissue damage up to the surface. An open fracture is a type of injury that may involve nonsutured lacerations. An open fracture is a break in a bone that happens along with one or more lacerations  through the skin that is near the fracture site. HOW TO CARE FOR YOUR NONSUTURED LACERATION  Take or apply over-the-counter and prescription medicines only as told by your health care provider.  If you were prescribed an antibiotic medicine, take or apply it as told by your health care provider. Do not stop using the antibiotic even if your condition improves.  Clean the wound one time each day or as told by your health care provider.  Wash the wound with mild soap and water.  Rinse the wound with water to remove all soap.  Pat your wound dry with a clean towel. Do not rub the wound.  Do not inject anything into the wound unless your health care provider told you to.  Change any bandages (dressings) as told by your health care provider. This  includes changing the dressing if it gets wet, dirty, or starts to smell bad.  Keep the dressing dry until your health care provider says it can be removed. Do not take baths, swim, or do anything that puts your wound underwater until your health care provider approves.  Raise (elevate) the injured area above the level of your heart while you are sitting or lying down, if possible.  Do not scratch or pick at the wound.  Check your wound every day for signs of infection. Watch for:  Redness, swelling, or pain.  Fluid, blood, or pus.  Keep all follow-up visits as told by your health care provider. This is important. SEEK MEDICAL CARE IF:  You received a tetanus and shot and you have swelling, severe pain, redness, or bleeding at the injection site.   You have a fever.  Your pain is not controlled with medicine.  You have increased redness, swelling, or pain at the site of your wound.  You have fluid, blood, or pus coming from your wound.  You notice a bad smell coming from your wound or your dressing.  You notice something coming out of the wound, such as wood or glass.  You notice a change in the color of your skin near your  wound.  You develop a new rash.  You need to change the dressing frequently due to fluid, blood, or pus draining from the wound.  You develop numbness around your wound. SEEK IMMEDIATE MEDICAL CARE IF:  Your pain suddenly increases and is severe.  You develop severe swelling around the wound.  The wound is on your hand or foot and you cannot properly move a finger or toe.  The wound is on your hand or foot and you notice that your fingers or toes look pale or bluish.  You have a red streak going away from your wound.   This information is not intended to replace advice given to you by your health care provider. Make sure you discuss any questions you have with your health care provider.   Document Released: 11/01/2006 Document Revised: 04/20/2015 Document Reviewed: 11/30/2014 Elsevier Interactive Patient Education [employer]

## 2016-06-14 NOTE — ED Notes (Signed)
Patient closed thumb tip in a van door prior to arrival at [facility].  Patient laceration to base of nail bed.  Bleeding controlled.

## 2016-06-14 NOTE — ED Notes (Signed)
TDaP documented on 12/09/2013

## 2019-07-08 ENCOUNTER — Encounter: Payer: Self-pay | Admitting: Family Medicine

## 2019-07-08 ENCOUNTER — Other Ambulatory Visit: Payer: Self-pay

## 2019-07-08 ENCOUNTER — Ambulatory Visit: Payer: BC Managed Care – PPO | Admitting: Family Medicine

## 2019-07-08 VITALS — BP 133/85 | HR 83 | Temp 98.4°F | Resp 17 | Ht 67.0 in | Wt 158.8 lb

## 2019-07-08 DIAGNOSIS — Z1329 Encounter for screening for other suspected endocrine disorder: Secondary | ICD-10-CM | POA: Diagnosis not present

## 2019-07-08 DIAGNOSIS — Z131 Encounter for screening for diabetes mellitus: Secondary | ICD-10-CM | POA: Diagnosis not present

## 2019-07-08 DIAGNOSIS — Z0001 Encounter for general adult medical examination with abnormal findings: Secondary | ICD-10-CM

## 2019-07-08 DIAGNOSIS — Z8349 Family history of other endocrine, nutritional and metabolic diseases: Secondary | ICD-10-CM

## 2019-07-08 DIAGNOSIS — Z0000 Encounter for general adult medical examination without abnormal findings: Secondary | ICD-10-CM

## 2019-07-08 DIAGNOSIS — G8929 Other chronic pain: Secondary | ICD-10-CM

## 2019-07-08 DIAGNOSIS — Z13220 Encounter for screening for lipoid disorders: Secondary | ICD-10-CM

## 2019-07-08 DIAGNOSIS — M545 Low back pain, unspecified: Secondary | ICD-10-CM

## 2019-07-08 DIAGNOSIS — Z1321 Encounter for screening for nutritional disorder: Secondary | ICD-10-CM

## 2019-07-08 NOTE — Progress Notes (Signed)
Chief Complaint  Patient presents with   New Patient (Initial Visit)    establish care. No concerns    Subjective:  Jasmine Bowen is a 43 y.o. female here for a health maintenance visit.  Patient is new pt  Patient reports some low back pain that goes across the back and hurts with turning movements She reports that when she turns it is not better or worse She states that it seems to be always present    Patient Active Problem List   Diagnosis Date Noted   Active labor 03/12/2014    Past Medical History:  Diagnosis Date   Medical history non-contributory     Past Surgical History:  Procedure Laterality Date   WISDOM TOOTH EXTRACTION       Outpatient Medications Prior to Visit  Medication Sig Dispense Refill   Prenatal Vit-Fe Fumarate-FA (PRENATAL MULTIVITAMIN) TABS tablet Take 1 tablet by mouth daily at 12 noon.     HYDROcodone-acetaminophen (NORCO/VICODIN) 5-325 MG tablet Take 1 tablet by mouth every 4 (four) hours as needed. (Patient not taking: Reported on 07/08/2019) 15 tablet 0   ibuprofen (ADVIL,MOTRIN) 800 MG tablet Take 1 tablet (800 mg total) by mouth every 8 (eight) hours as needed for moderate pain. (Patient not taking: Reported on 07/08/2019) 30 tablet 0   No facility-administered medications prior to visit.     No Known Allergies   Family History  Problem Relation Age of Onset   Cancer Father        prostate cancer   Heart disease Father    Hyperlipidemia Father    Hypertension Father    Diabetes Maternal Grandmother    Heart disease Paternal Grandfather    Hypertension Paternal Grandfather    Hyperlipidemia Paternal Grandfather    Stroke Paternal Grandfather      Health Habits: Dental Exam: up to date Eye Exam: up to date Exercise: 3 times/week on average Current exercise activities: walking/running Diet: balanced  Social History   Socioeconomic History   Marital status: Married    Spouse name: Not on file   Number  of children: Not on file   Years of education: Not on file   Highest education level: Not on file  Occupational History   Not on file  Social Needs   Financial resource strain: Not hard at all   Food insecurity    Worry: Never true    Inability: Never true   Transportation needs    Medical: No    Non-medical: No  Tobacco Use   Smoking status: Never Smoker   Smokeless tobacco: Never Used  Substance and Sexual Activity   Alcohol use: No   Drug use: No   Sexual activity: Yes  Lifestyle   Physical activity    Days per week: 3 days    Minutes per session: 90 min   Stress: Not at all  [place] on phone: More than three times a week    Gets together: More than three times a week    Attends religious service: More than 4 times per year    Active member of club or organization: Yes    Attends meetings of clubs or organizations: More than 4 times per year    Relationship status: Married   Intimate partner violence    Fear of current or ex partner: No    Emotionally abused: No    Physically abused: No    Forced sexual activity: No  Other Topics Concern   Not on file  Social History Narrative   Not on file   Social History   Substance and Sexual Activity  Alcohol Use No   Social History   Tobacco Use  Smoking Status Never Smoker  Smokeless Tobacco Never Used   Social History   Substance and Sexual Activity  Drug Use No    GYN: Sexual Health Menstrual status: regular menses LMP: Patient's last menstrual period was 07/06/2019. Last pap smear: see HM section History of abnormal pap smears:  Sexually active:  with female partner   Health Maintenance: See under health Maintenance activity for review of completion dates as well. Immunization History  Administered Date(s) Administered   Influenza-Unspecified 09/10/2013   Tdap 12/09/2013      Depression Screen-PHQ2/9 Depression screen PHQ 2/9 07/08/2019    Decreased Interest 0  Down, Depressed, Hopeless 0  PHQ - 2 Score 0       Depression Severity and Treatment Recommendations:  0-4= None  5-9= Mild / Treatment: Support, educate to call if worse; return in one month  10-14= Moderate / Treatment: Support, watchful waiting; Antidepressant or Psycotherapy  15-19= Moderately severe / Treatment: Antidepressant OR Psychotherapy  >= 20 = Major depression, severe / Antidepressant AND Psychotherapy    Review of Systems   ROS  See HPI for ROS as well.   Review of Systems  Constitutional: Negative for activity change, appetite change, chills and fever.  HENT: Negative for congestion, nosebleeds, trouble swallowing and voice change.   Respiratory: Negative for cough, shortness of breath and wheezing.   Gastrointestinal: Negative for diarrhea, nausea and vomiting.  Genitourinary: Negative for difficulty urinating, dysuria, flank pain and hematuria.  Musculoskeletal: Negative for back pain, joint swelling and neck pain.  Neurological: Negative for dizziness, speech difficulty, light-headedness and numbness.  See HPI. All other review of systems negative.   Objective:   Vitals:   07/08/19 1128  BP: 133/85  Pulse: 83  Resp: 17  Temp: 98.4 F (36.9 C)  TempSrc: Oral  SpO2: 98%  Weight: 158 lb 12.8 oz (72 kg)  Height: _0  (1.702 m)    Body mass index is 24.87 kg/m.  Physical Exam Constitutional:      Appearance: Normal appearance.  HENT:     Head: Normocephalic and atraumatic.     Nose: Nose normal. No congestion.  Eyes:     Extraocular Movements: Extraocular movements intact.     Conjunctiva/sclera: Conjunctivae normal.  Cardiovascular:     Rate and Rhythm: Normal rate and regular rhythm.     Heart sounds: No murmur.  Pulmonary:     Effort: Pulmonary effort is normal. No respiratory distress.     Breath sounds: Normal breath sounds. No stridor. No wheezing, rhonchi or rales.  Chest:     Chest wall: No tenderness.   Abdominal:     General: Abdomen is flat. Bowel sounds are normal. There is no distension.     Palpations: There is no mass.     Tenderness: There is no abdominal tenderness. There is no right CVA tenderness, left CVA tenderness, guarding or rebound.     Hernia: No hernia is present.  Musculoskeletal: Normal range of motion.        General: No swelling, tenderness, deformity or signs of injury.     Right lower leg: No edema.     Left lower leg: No edema.  Skin:    General: Skin is warm.     Capillary Refill:  Capillary refill takes less than 2 seconds.  Neurological:     General: No focal deficit present.     Mental Status: She is alert and oriented to person, place, and time.  Psychiatric:        Mood and Affect: Mood normal.        Behavior: Behavior normal.        Thought Content: Thought content normal.        Judgment: Judgment normal.        Assessment/Plan:   Patient was seen for a health maintenance exam.  Counseled the patient on health maintenance issues. Reviewed her health mainteance schedule and ordered appropriate tests (see orders.) Counseled on regular exercise and weight management. Recommend regular eye exams and dental cleaning.   The following issues were addressed today for health maintenance:   Zettie was seen today for new patient (initial visit).  Diagnoses and all orders for this visit:  Encounter for health maintenance examination in adult- Women's Health Maintenance Plan Advised monthly breast exam and annual mammogram Advised dental exam every six months Discussed stress management Discussed pap smear screening guidelines   Screening for diabetes mellitus -     CMP14+EGFR -     Hemoglobin A1c  Family history of hypothyroidism -     TSH  Screening for thyroid disorder -     TSH  Screening, lipid -     Lipid panel  Encounter for vitamin deficiency screening -     VITAMIN D 25 Hydroxy (Vit-D Deficiency, Fractures)  Chronic  bilateral low back pain without sciatica-  Advised pt to help with this back pain  Seems musculature -     Ambulatory referral to Physical Therapy    No follow-ups on file.    Body mass index is 24.87 kg/m.:  Discussed the patient's BMI with patient. The BMI body mass index is 24.87 kg/m.     No future appointments.  Patient Instructions       If you have lab work done today you will be contacted with your lab results within the next 2 weeks.  If you have not heard from [place] then please contact us. The fastest way to get your results is to register for My Chart.   IF you received an x-ray today, you will receive an invoice from [facility]. Please contact [facility] at [phone] with questions or concerns regarding your invoice.   IF you received labwork today, you will receive an invoice from [place]. Please contact [facility] at [phone] with questions or concerns regarding your invoice.   Our billing staff will not be able to assist you with questions regarding bills from these companies.  You will be contacted with the lab results as soon as they are available. The fastest way to get your results is to activate your My Chart account. Instructions are located on the last page of this paperwork. If you have not heard from [place] regarding the results in 2 weeks, please contact this office.       Low Back Sprain or Strain Rehab Ask your health care provider which exercises are safe for you. Do exercises exactly as told by your health care provider and adjust them as directed. It is normal to feel mild stretching, pulling, tightness, or discomfort as you do these exercises. Stop right away if you feel sudden pain or your pain gets worse. Do not begin these exercises until told by your health care provider. Stretching and range-of-motion exercises These  exercises warm up your muscles and joints and improve the movement and flexibility of your back. These  exercises also help to relieve pain, numbness, and tingling. Lumbar rotation  1. Lie on your back on a firm surface and bend your knees. 2. Straighten your arms out to your sides so each arm forms a 90-degree angle (right angle) with a side of your body. 3. Slowly move (rotate) both of your knees to one side of your body until you feel a stretch in your lower back (lumbar). Try not to let your shoulders lift off the floor. 4. Hold this position for __________ seconds. 5. Tense your abdominal muscles and slowly move your knees back to the starting position. 6. Repeat this exercise on the other side of your body. Repeat __________ times. Complete this exercise __________ times a day. Single knee to chest  1. Lie on your back on a firm surface with both legs straight. 2. Bend one of your knees. Use your hands to move your knee up toward your chest until you feel a gentle stretch in your lower back and buttock. ? Hold your leg in this position by holding on to the front of your knee. ? Keep your other leg as straight as possible. 3. Hold this position for __________ seconds. 4. Slowly return to the starting position. 5. Repeat with your other leg. Repeat __________ times. Complete this exercise __________ times a day. Prone extension on elbows  1. Lie on your abdomen on a firm surface (prone position). 2. Prop yourself up on your elbows. 3. Use your arms to help lift your chest up until you feel a gentle stretch in your abdomen and your lower back. ? This will place some of your body weight on your elbows. If this is uncomfortable, try stacking pillows under your chest. ? Your hips should stay down, against the surface that you are lying on. Keep your hip and back muscles relaxed. 4. Hold this position for __________ seconds. 5. Slowly relax your upper body and return to the starting position. Repeat __________ times. Complete this exercise __________ times a day. Strengthening  exercises These exercises build strength and endurance in your back. Endurance is the ability to use your muscles for a long time, even after they get tired. Pelvic tilt This exercise strengthens the muscles that lie deep in the abdomen. 1. Lie on your back on a firm surface. Bend your knees and keep your feet flat on the floor. 2. Tense your abdominal muscles. Tip your pelvis up toward the ceiling and flatten your lower back into the floor. ? To help with this exercise, you may place a small towel under your lower back and try to push your back into the towel. 3. Hold this position for __________ seconds. 4. Let your muscles relax completely before you repeat this exercise. Repeat __________ times. Complete this exercise __________ times a day. Alternating arm and leg raises  1. Get on your hands and knees on a firm surface. If you are on a hard floor, you may want to use padding, such as an exercise mat, to cushion your knees. 2. Line up your arms and legs. Your hands should be directly below your shoulders, and your knees should be directly below your hips. 3. Lift your left leg behind you. At the same time, raise your right arm and straighten it in front of you. ? Do not lift your leg higher than your hip. ? Do not lift your arm higher  than your shoulder. ? Keep your abdominal and back muscles tight. ? Keep your hips facing the ground. ? Do not arch your back. ? Keep your balance carefully, and do not hold your breath. 4. Hold this position for __________ seconds. 5. Slowly return to the starting position. 6. Repeat with your right leg and your left arm. Repeat __________ times. Complete this exercise __________ times a day. Abdominal set with straight leg raise  1. Lie on your back on a firm surface. 2. Bend one of your knees and keep your other leg straight. 3. Tense your abdominal muscles and lift your straight leg up, 4-6 inches (10-15 cm) off the ground. 4. Keep your abdominal  muscles tight and hold this position for __________ seconds. ? Do not hold your breath. ? Do not arch your back. Keep it flat against the ground. 5. Keep your abdominal muscles tense as you slowly lower your leg back to the starting position. 6. Repeat with your other leg. Repeat __________ times. Complete this exercise __________ times a day. Single leg lower with bent knees 1. Lie on your back on a firm surface. 2. Tense your abdominal muscles and lift your feet off the floor, one foot at a time, so your knees and hips are bent in 90-degree angles (right angles). ? Your knees should be over your hips and your lower legs should be parallel to the floor. 3. Keeping your abdominal muscles tense and your knee bent, slowly lower one of your legs so your toe touches the ground. 4. Lift your leg back up to return to the starting position. ? Do not hold your breath. ? Do not let your back arch. Keep your back flat against the ground. 5. Repeat with your other leg. Repeat __________ times. Complete this exercise __________ times a day. Posture and body mechanics Good posture and healthy body mechanics can help to relieve stress in your body's tissues and joints. Body mechanics refers to the movements and positions of your body while you do your daily activities. Posture is part of body mechanics. Good posture means:  Your spine is in its natural S-curve position (neutral).  Your shoulders are pulled back slightly.  Your head is not tipped forward. Follow these guidelines to improve your posture and body mechanics in your everyday activities. Standing   When standing, keep your spine neutral and your feet about hip width apart. Keep a slight bend in your knees. Your ears, shoulders, and hips should line up.  When you do a task in which you stand in one place for a long time, place one foot up on a stable object that is 2-4 inches (5-10 cm) high, such as a footstool. This helps keep your spine  neutral. Sitting   When sitting, keep your spine neutral and keep your feet flat on the floor. Use a footrest, if necessary, and keep your thighs parallel to the floor. Avoid rounding your shoulders, and avoid tilting your head forward.  When working at a desk or a computer, keep your desk at a height where your hands are slightly lower than your elbows. Slide your chair under your desk so you are close enough to maintain good posture.  When working at a computer, place your monitor at a height where you are looking straight ahead and you do not have to tilt your head forward or downward to look at the screen. Resting  When lying down and resting, avoid positions that are most painful for you.  If you have pain with activities such as sitting, bending, stooping, or squatting, lie in a position in which your body does not bend very much. For example, avoid curling up on your side with your arms and knees near your chest (fetal position).  If you have pain with activities such as standing for a long time or reaching with your arms, lie with your spine in a neutral position and bend your knees slightly. Try the following positions: ? Lying on your side with a pillow between your knees. ? Lying on your back with a pillow under your knees. Lifting   When lifting objects, keep your feet at least shoulder width apart and tighten your abdominal muscles.  Bend your knees and hips and keep your spine neutral. It is important to lift using the strength of your legs, not your back. Do not lock your knees straight out.  Always ask for help to lift heavy or awkward objects. This information is not intended to replace advice given to you by your health care provider. Make sure you discuss any questions you have with your health care provider. Document Released: 12/04/2005 Document Revised: 03/28/2019 Document Reviewed: 12/26/2018 [employer]vier Patient Education  2020 [employer].

## 2019-07-08 NOTE — Patient Instructions (Addendum)
   If you have lab work done today you will be contacted with your lab results within the next 2 weeks.  If you have not heard from us then please contact us. The fastest way to get your results is to register for My Chart.   IF you received an x-ray today, you will receive an invoice from [facility]. Please contact [facility] at [phone] with questions or concerns regarding your invoice.   IF you received labwork today, you will receive an invoice from [facility]. Please contact [facility] at [phone] with questions or concerns regarding your invoice.   Our billing staff will not be able to assist you with questions regarding bills from these companies.  You will be contacted with the lab results as soon as they are available. The fastest way to get your results is to activate your My Chart account. Instructions are located on the last page of this paperwork. If you have not heard from us regarding the results in 2 weeks, please contact this office.     Low Back Sprain or Strain Rehab Ask your health care provider which exercises are safe for you. Do exercises exactly as told by your health care provider and adjust them as directed. It is normal to feel mild stretching, pulling, tightness, or discomfort as you do these exercises. Stop right away if you feel sudden pain or your pain gets worse. Do not begin these exercises until told by your health care provider. Stretching and range-of-motion exercises These exercises warm up your muscles and joints and improve the movement and flexibility of your back. These exercises also help to relieve pain, numbness, and tingling. Lumbar rotation  1. Lie on your back on a firm surface and bend your knees. 2. Straighten your arms out to your sides so each arm forms a 90-degree angle (right angle) with a side of your body. 3. Slowly move (rotate) both of your knees to one side of your body until you feel a stretch in your lower  back (lumbar). Try not to let your shoulders lift off the floor. 4. Hold this position for __________ seconds. 5. Tense your abdominal muscles and slowly move your knees back to the starting position. 6. Repeat this exercise on the other side of your body. Repeat __________ times. Complete this exercise __________ times a day. Single knee to chest  1. Lie on your back on a firm surface with both legs straight. 2. Bend one of your knees. Use your hands to move your knee up toward your chest until you feel a gentle stretch in your lower back and buttock. ? Hold your leg in this position by holding on to the front of your knee. ? Keep your other leg as straight as possible. 3. Hold this position for __________ seconds. 4. Slowly return to the starting position. 5. Repeat with your other leg. Repeat __________ times. Complete this exercise __________ times a day. Prone extension on elbows  1. Lie on your abdomen on a firm surface (prone position). 2. Prop yourself up on your elbows. 3. Use your arms to help lift your chest up until you feel a gentle stretch in your abdomen and your lower back. ? This will place some of your body weight on your elbows. If this is uncomfortable, try stacking pillows under your chest. ? Your hips should stay down, against the surface that you are lying on. Keep your hip and back muscles relaxed. 4. Hold this position for __________   seconds. 5. Slowly relax your upper body and return to the starting position. Repeat __________ times. Complete this exercise __________ times a day. Strengthening exercises These exercises build strength and endurance in your back. Endurance is the ability to use your muscles for a long time, even after they get tired. Pelvic tilt This exercise strengthens the muscles that lie deep in the abdomen. 1. Lie on your back on a firm surface. Bend your knees and keep your feet flat on the floor. 2. Tense your abdominal muscles. Tip your  pelvis up toward the ceiling and flatten your lower back into the floor. ? To help with this exercise, you may place a small towel under your lower back and try to push your back into the towel. 3. Hold this position for __________ seconds. 4. Let your muscles relax completely before you repeat this exercise. Repeat __________ times. Complete this exercise __________ times a day. Alternating arm and leg raises  1. Get on your hands and knees on a firm surface. If you are on a hard floor, you may want to use padding, such as an exercise mat, to cushion your knees. 2. Line up your arms and legs. Your hands should be directly below your shoulders, and your knees should be directly below your hips. 3. Lift your left leg behind you. At the same time, raise your right arm and straighten it in front of you. ? Do not lift your leg higher than your hip. ? Do not lift your arm higher than your shoulder. ? Keep your abdominal and back muscles tight. ? Keep your hips facing the ground. ? Do not arch your back. ? Keep your balance carefully, and do not hold your breath. 4. Hold this position for __________ seconds. 5. Slowly return to the starting position. 6. Repeat with your right leg and your left arm. Repeat __________ times. Complete this exercise __________ times a day. Abdominal set with straight leg raise  1. Lie on your back on a firm surface. 2. Bend one of your knees and keep your other leg straight. 3. Tense your abdominal muscles and lift your straight leg up, 4-6 inches (10-15 cm) off the ground. 4. Keep your abdominal muscles tight and hold this position for __________ seconds. ? Do not hold your breath. ? Do not arch your back. Keep it flat against the ground. 5. Keep your abdominal muscles tense as you slowly lower your leg back to the starting position. 6. Repeat with your other leg. Repeat __________ times. Complete this exercise __________ times a day. Single leg lower with bent  knees 1. Lie on your back on a firm surface. 2. Tense your abdominal muscles and lift your feet off the floor, one foot at a time, so your knees and hips are bent in 90-degree angles (right angles). ? Your knees should be over your hips and your lower legs should be parallel to the floor. 3. Keeping your abdominal muscles tense and your knee bent, slowly lower one of your legs so your toe touches the ground. 4. Lift your leg back up to return to the starting position. ? Do not hold your breath. ? Do not let your back arch. Keep your back flat against the ground. 5. Repeat with your other leg. Repeat __________ times. Complete this exercise __________ times a day. Posture and body mechanics Good posture and healthy body mechanics can help to relieve stress in your body's tissues and joints. Body mechanics refers to the movements and   positions of your body while you do your daily activities. Posture is part of body mechanics. Good posture means:  Your spine is in its natural S-curve position (neutral).  Your shoulders are pulled back slightly.  Your head is not tipped forward. Follow these guidelines to improve your posture and body mechanics in your everyday activities. Standing   When standing, keep your spine neutral and your feet about hip width apart. Keep a slight bend in your knees. Your ears, shoulders, and hips should line up.  When you do a task in which you stand in one place for a long time, place one foot up on a stable object that is 2-4 inches (5-10 cm) high, such as a footstool. This helps keep your spine neutral. Sitting   When sitting, keep your spine neutral and keep your feet flat on the floor. Use a footrest, if necessary, and keep your thighs parallel to the floor. Avoid rounding your shoulders, and avoid tilting your head forward.  When working at a desk or a computer, keep your desk at a height where your hands are slightly lower than your elbows. Slide your chair  under your desk so you are close enough to maintain good posture.  When working at a computer, place your monitor at a height where you are looking straight ahead and you do not have to tilt your head forward or downward to look at the screen. Resting  When lying down and resting, avoid positions that are most painful for you.  If you have pain with activities such as sitting, bending, stooping, or squatting, lie in a position in which your body does not bend very much. For example, avoid curling up on your side with your arms and knees near your chest (fetal position).  If you have pain with activities such as standing for a long time or reaching with your arms, lie with your spine in a neutral position and bend your knees slightly. Try the following positions: ? Lying on your side with a pillow between your knees. ? Lying on your back with a pillow under your knees. Lifting   When lifting objects, keep your feet at least shoulder width apart and tighten your abdominal muscles.  Bend your knees and hips and keep your spine neutral. It is important to lift using the strength of your legs, not your back. Do not lock your knees straight out.  Always ask for help to lift heavy or awkward objects. This information is not intended to replace advice given to you by your health care provider. Make sure you discuss any questions you have with your health care provider. Document Released: 12/04/2005 Document Revised: 03/28/2019 Document Reviewed: 12/26/2018 Elsevier Patient Education  2020 [employer]  

## 2019-07-09 LAB — CMP14+EGFR
ALT: 10 IU/L (ref 0–32)
AST: 15 IU/L (ref 0–40)
Albumin/Globulin Ratio: 1.7 (ref 1.2–2.2)
Albumin: 4.3 g/dL (ref 3.8–4.8)
Alkaline Phosphatase: 53 IU/L (ref 39–117)
BUN/Creatinine Ratio: 8 — ABNORMAL LOW (ref 9–23)
BUN: 6 mg/dL (ref 6–24)
Bilirubin Total: 0.4 mg/dL (ref 0.0–1.2)
CO2: 21 mmol/L (ref 20–29)
Calcium: 9.4 mg/dL (ref 8.7–10.2)
Chloride: 102 mmol/L (ref 96–106)
Creatinine, Ser: 0.72 mg/dL (ref 0.57–1.00)
GFR calc Af Amer: 120 mL/min/{1.73_m2} (ref >59)
GFR calc non Af Amer: 104 mL/min/{1.73_m2} (ref >59)
Globulin, Total: 2.5 g/dL (ref 1.5–4.5)
Glucose: 100 mg/dL — ABNORMAL HIGH (ref 65–99)
Potassium: 4.2 mmol/L (ref 3.5–5.2)
Sodium: 138 mmol/L (ref 134–144)
Total Protein: 6.8 g/dL (ref 6.0–8.5)

## 2019-07-09 LAB — HEMOGLOBIN A1C
Est. average glucose Bld gHb Est-mCnc: 94 mg/dL
Hgb A1c MFr Bld: 4.9 % (ref 4.8–5.6)

## 2019-07-09 LAB — LIPID PANEL
Chol/HDL Ratio: 4.1 ratio (ref 0.0–4.4)
Cholesterol, Total: 136 mg/dL (ref 100–199)
HDL: 33 mg/dL — ABNORMAL LOW (ref >39)
LDL Calculated: 70 mg/dL (ref 0–99)
Triglycerides: 166 mg/dL — ABNORMAL HIGH (ref 0–149)
VLDL Cholesterol Cal: 33 mg/dL (ref 5–40)

## 2019-07-09 LAB — VITAMIN D 25 HYDROXY (VIT D DEFICIENCY, FRACTURES): Vit D, 25-Hydroxy: 35.8 ng/mL (ref 30.0–100.0)

## 2019-07-09 LAB — TSH: TSH: 2.45 u[IU]/mL (ref 0.450–4.500)

## 2019-07-14 ENCOUNTER — Encounter: Payer: Self-pay | Admitting: Family Medicine

## 2019-07-15 ENCOUNTER — Encounter: Payer: Self-pay | Admitting: Family Medicine

## 2019-07-18 ENCOUNTER — Telehealth: Payer: Self-pay | Admitting: Family Medicine

## 2019-07-18 DIAGNOSIS — Z111 Encounter for screening for respiratory tuberculosis: Secondary | ICD-10-CM

## 2019-07-18 NOTE — Telephone Encounter (Signed)
Copied from [place] [id number]. Topic: General - Inquiry >> Jul 18, 2019 10:54 AM Richardo Priest, NT wrote: Reason for CRM: Patient called in stating she would like her forms filled out that she sent on mychart ASAP, due to needing them for school. Please advise.

## 2019-07-18 NOTE — Telephone Encounter (Signed)
Forms printed off and will place in provider box for completion.  Pt notified. Dgaddy, CMA

## 2019-07-24 ENCOUNTER — Telehealth: Payer: Self-pay | Admitting: Family Medicine

## 2019-07-24 NOTE — Telephone Encounter (Signed)
Patient had paperwork that needed to be filled out for school , it has been printed off and given to provider per Ms. Delores phone call  Patient would like to know if they can be signed and scanned back into her MyChart immediately she needs them for school   Patient ph# [phone]

## 2019-07-28 NOTE — Telephone Encounter (Signed)
Patient called in stating she is needing her paperwork filled out and would like a call back to check on everything. Please advise. Call back is [phone]

## 2019-07-29 NOTE — Telephone Encounter (Signed)
Patient calling back about paperwork. She is requesting a call back as soon as possible.

## 2019-07-30 ENCOUNTER — Ambulatory Visit: Payer: BC Managed Care – PPO

## 2019-07-30 ENCOUNTER — Telehealth: Payer: Self-pay

## 2019-07-30 ENCOUNTER — Other Ambulatory Visit: Payer: Self-pay

## 2019-07-30 DIAGNOSIS — Z111 Encounter for screening for respiratory tuberculosis: Secondary | ICD-10-CM

## 2019-07-30 NOTE — Telephone Encounter (Signed)
Please notify the patient to come in for the quantiferon gold and I will send the result in a lab letter. The rest is completed.

## 2019-07-30 NOTE — Telephone Encounter (Signed)
Pt uploaded forms for school for [employer] to her mychart. Sent Dr a message to inbox to inform her of this

## 2019-07-30 NOTE — Telephone Encounter (Signed)
Dr. Nolon Rod has given the paperwork to [facility] for her to complete and call pt for pick up

## 2019-07-30 NOTE — Telephone Encounter (Signed)
Spoke with pt advised form completed and order placed for quantiferon TB gold for pt to come in for lab draw.  Pt verbalized understanding an advised form will be at front desk for pickup.  Copy placed in scan box to be scanned in pt chart and [facility] [place] kept a copy just in case pt needs copy as it takes awhile before forms will be scanned into chart. Dgaddy, CMA

## 2019-07-31 ENCOUNTER — Telehealth: Payer: Self-pay | Admitting: Family Medicine

## 2019-07-31 NOTE — Telephone Encounter (Signed)
Pt is requesting "cse lab letter" to upload to [place] today. Please advise

## 2019-08-01 LAB — QUANTIFERON-TB GOLD PLUS
QuantiFERON Mitogen Value: >10 IU/mL
QuantiFERON Nil Value: 0.01 IU/mL
QuantiFERON TB1 Ag Value: 0.01 IU/mL
QuantiFERON TB2 Ag Value: 0.01 IU/mL
QuantiFERON-TB Gold Plus: NEGATIVE

## 2019-08-05 ENCOUNTER — Encounter: Payer: Self-pay | Admitting: Family Medicine

## 2019-08-05 NOTE — Telephone Encounter (Signed)
Lab letter sent to pt Mychart and pt aware.

## 2019-09-15 ENCOUNTER — Telehealth: Payer: Self-pay | Admitting: Family Medicine

## 2019-09-15 NOTE — Telephone Encounter (Signed)
Pt requesting cb for advice on measles, chicken pox, hep b titer Pt had pprwrk denied for employment due to labs not showing results for the mentioned vaccines Pt would like advice on what to do next

## 2019-09-16 NOTE — Telephone Encounter (Signed)
Dr Nolon Rod advise and will call pt with your feedback.

## 2019-09-18 ENCOUNTER — Other Ambulatory Visit: Payer: Self-pay

## 2019-09-18 ENCOUNTER — Ambulatory Visit (INDEPENDENT_AMBULATORY_CARE_PROVIDER_SITE_OTHER): Payer: BC Managed Care – PPO | Admitting: Family Medicine

## 2019-09-18 DIAGNOSIS — Z23 Encounter for immunization: Secondary | ICD-10-CM | POA: Diagnosis not present

## 2019-09-19 ENCOUNTER — Other Ambulatory Visit: Payer: Self-pay

## 2019-09-19 ENCOUNTER — Ambulatory Visit: Payer: BC Managed Care – PPO | Admitting: Adult Health Nurse Practitioner

## 2019-09-19 ENCOUNTER — Encounter: Payer: Self-pay | Admitting: Adult Health Nurse Practitioner

## 2019-09-19 VITALS — BP 143/85 | HR 89 | Temp 98.5°F | Resp 17 | Ht 67.0 in | Wt 158.4 lb

## 2019-09-19 DIAGNOSIS — Z289 Immunization not carried out for unspecified reason: Secondary | ICD-10-CM

## 2019-09-19 HISTORY — DX: Immunization not carried out for unspecified reason: Z28.9

## 2019-09-19 NOTE — Telephone Encounter (Signed)
Pt will get titers drawn. She is aware that she might need vaccines depending on her levels of titer

## 2019-09-19 NOTE — Progress Notes (Signed)
Established Patient Office Visit  Subjective:  Patient ID: Jasmine Bowen, female    DOB: 1976-09-17  Age: 43 y.o. MRN: [id number]  CC:  Chief Complaint  Patient presents with  . needs titers drawn for hep b, varicella and mmr    HPI Jasmine Bowen presents for titers of MMR, Hep B, and varicella.  No problems.  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   Past Medical History:  Diagnosis Date  . Delayed immunizations 09/19/2019  . Medical history non-contributory     Past Surgical History:  Procedure Laterality Date  . WISDOM TOOTH EXTRACTION      Family History  Problem Relation Age of Onset  . Cancer Father        prostate cancer  . Heart disease Father   . Hyperlipidemia Father   . Hypertension Father   . Diabetes Maternal Grandmother   . Heart disease Paternal Grandfather   . Hypertension Paternal Grandfather   . Hyperlipidemia Paternal Grandfather   . Stroke Paternal Grandfather     Social History   Socioeconomic History  . Marital status: Married    Spouse name: Not on file  . Number of children: Not on file  . Years of education: Not on file  . Highest education level: Not on file  Occupational History  . Not on file  Social Needs  . Financial resource strain: Not hard at all  . Food insecurity    Worry: Never true    Inability: Never true  . Transportation needs    Medical: No    Non-medical: No  Tobacco Use  . Smoking status: Never Smoker  . Smokeless tobacco: Never Used  Substance and Sexual Activity  . Alcohol use: No  . Drug use: No  . Sexual activity: Yes  Lifestyle  . Physical activity    Days per week: 3 days    Minutes per session: 90 min  . Stress: Not at all  Relationships  . Social connections    Talks on phone: More than three times a week    Gets together: More than three times a week    Attends religious service: More than 4 times per year    Active member of club or organization: Yes    Attends meetings  of clubs or organizations: More than 4 times per year    Relationship status: Married  . Intimate partner violence    Fear of current or ex partner: No    Emotionally abused: No    Physically abused: No    Forced sexual activity: No  Other Topics Concern  . Not on file  Social History Narrative  . Not on file    Outpatient Medications Prior to Visit  Medication Sig Dispense Refill  . Prenatal Vit-Fe Fumarate-FA (PRENATAL MULTIVITAMIN) TABS tablet Take 1 tablet by mouth daily at 12 noon.     No facility-administered medications prior to visit.     No Known Allergies  ROS Review of Systems Review of Systems See HPI Constitution: No fevers or chills No malaise No diaphoresis Skin: No rash or itching Eyes: no blurry vision, no double vision GU: no dysuria or hematuria Neuro: no dizziness or headaches    Objective:    Physical Exam  Constitutional: She appears well-developed and well-nourished.  Cardiovascular: Normal rate, regular rhythm and normal heart sounds.  Pulmonary/Chest: Effort normal and breath sounds normal.  Vitals reviewed.      BP (!) 143/85 (  BP Location: Right Arm, Patient Position: Sitting, Cuff Size: Normal)   Pulse 89   Temp 98.5 F (36.9 C) (Oral)   Resp 17   Ht 5' 7"  (1.702 m)   Wt 158 lb 6.4 oz (71.8 kg)   LMP 09/18/2019   SpO2 97%   BMI 24.81 kg/m  Wt Readings from Last 3 Encounters:  09/19/19 158 lb 6.4 oz (71.8 kg)  07/08/19 158 lb 12.8 oz (72 kg)  03/12/14 161 lb 6.4 oz (73.2 kg)     Health Maintenance Due  Topic Date Due  . PAP SMEAR-Modifier  06/07/1999    There are no preventive care reminders to display for this patient.  Lab Results  Component Value Date   TSH 2.450 07/08/2019   Lab Results  Component Value Date   WBC 8.2 03/14/2014   HGB 9.4 (L) 03/14/2014   HCT 26.2 (L) 03/14/2014   MCV 92.6 03/14/2014   PLT 131 (L) 03/14/2014   Lab Results  Component Value Date   NA 138 07/08/2019   K 4.2 07/08/2019    CO2 21 07/08/2019   GLUCOSE 100 (H) 07/08/2019   BUN 6 07/08/2019   CREATININE 0.72 07/08/2019   BILITOT 0.4 07/08/2019   ALKPHOS 53 07/08/2019   AST 15 07/08/2019   ALT 10 07/08/2019   PROT 6.8 07/08/2019   ALBUMIN 4.3 07/08/2019   CALCIUM 9.4 07/08/2019   Lab Results  Component Value Date   CHOL 136 07/08/2019   Lab Results  Component Value Date   HDL 33 (L) 07/08/2019   Lab Results  Component Value Date   LDLCALC 70 07/08/2019   Lab Results  Component Value Date   TRIG 166 (H) 07/08/2019   Lab Results  Component Value Date   CHOLHDL 4.1 07/08/2019   Lab Results  Component Value Date   HGBA1C 4.9 07/08/2019      Assessment & Plan:   Problem List Items Addressed This Visit      Other   Delayed immunizations - Primary (Chronic)   Relevant Orders   Hepatitis B surface antibody,quantitative   Varicella Zoster Abs, IgG/IgM   Measles/Mumps/Rubella Immunity      No orders of the defined types were placed in this encounter.   Follow-up:  Will MyChart results to patient.  She is inline with this plan.     Glyn Ade, NP

## 2019-09-19 NOTE — Patient Instructions (Signed)
° ° ° °  If you have lab work done today you will be contacted with your lab results within the next 2 weeks.  If you have not heard from us then please contact us. The fastest way to get your results is to register for My Chart. ° ° °IF you received an x-ray today, you will receive an invoice from [facility]. Please contact  Radiology at [phone] with questions or concerns regarding your invoice.  ° °IF you received labwork today, you will receive an invoice from [facility]. Please contact [facility] at [phone] with questions or concerns regarding your invoice.  ° °Our billing staff will not be able to assist you with questions regarding bills from these companies. ° °You will be contacted with the lab results as soon as they are available. The fastest way to get your results is to activate your My Chart account. Instructions are located on the last page of this paperwork. If you have not heard from us regarding the results in 2 weeks, please contact this office. °  ° ° ° °

## 2019-09-23 ENCOUNTER — Encounter: Payer: Self-pay | Admitting: Adult Health Nurse Practitioner

## 2019-09-24 LAB — MEASLES/MUMPS/RUBELLA IMMUNITY
MUMPS ABS, IGG: >300 AU/mL (ref >10.9)
RUBEOLA AB, IGG: 35.9 AU/mL (ref >16.4)
Rubella Antibodies, IGG: 3.39 index (ref >0.99)

## 2019-09-24 LAB — VARICELLA ZOSTER ABS, IGG/IGM
Varicella IgM: <0.91 index (ref 0.00–0.90)
Varicella zoster IgG: 1054 index (ref >165)

## 2019-09-24 LAB — HEPATITIS B SURFACE ANTIBODY, QUANTITATIVE: Hepatitis B Surf Ab Quant: >1000 m[IU]/mL (ref >9.9)

## 2019-12-27 ENCOUNTER — Telehealth: Payer: BC Managed Care – PPO | Admitting: Nurse Practitioner

## 2019-12-27 DIAGNOSIS — Z20822 Contact with and (suspected) exposure to covid-19: Secondary | ICD-10-CM

## 2019-12-27 NOTE — Progress Notes (Signed)
E-Visit for Corona Virus Screening   Your current symptoms could be consistent with the coronavirus.  Many health care providers can now test patients at their office but not all are.  [facility] has multiple testing sites. For information on our COVID testing locations and hours go to [email or web address]  We are enrolling you in our MyChart Home Monitoring for COVID19 . Daily you will receive a questionnaire within the MyChart website. Our COVID 19 response team will be monitoring your responses daily.  Testing Information: The COVID-19 Community Testing sites will begin testing BY APPOINTMENT ONLY.  You can schedule online at [email or web address]  If you do not have access to a smart phone or computer you may call [phone] for an appointment.  Testing Locations: Appointment schedule is 8 am to 3:30 pm at all sites  [place] indoors at [address], [place] [address] [facility]  indoors at [address] Visitors Entrance, [place], [place] [address] [place] indoors at [address], [place] [address]  Additional testing sites in the Community:  . For [facility] Testing sites in [place]  https://www.cvs.com/minuteclinic/covid-19-testing  . For Pop-up testing sites in [place]  https://covid19.ncdhhs.gov/about-covid-19/testing/find-my-testing-place/pop-testing-sites  . For Testing sites with regular hours https://onsms.org/[place]/  . For [facility] https://[facility].com/covid-19-community-outreach-testing/  . For [facility] https://www.[place].gov/[place]ounty/human-services/health-department/coronavirus-covid-19-info/covid-19-testing  . For [place] testing in  and [place] https://www.[place].gov/[place]-county/human-services/health-department/coronavirus-covid-19-info/covid-19-testing  . For [facility] testing in [place]   https://lhi.care/covidtesting  For  more  information about community testing call [phone]   We are enrolling you in our MyChart Home Monitoring for COVID19 . Daily you will receive a questionnaire within the MyChart website. Our COVID 19 response team will be monitoring your responses daily.  Please quarantine yourself while awaiting your test results. If you develop fever/cough/breathlessness, please stay home for 10 days with improving symptoms and until you have had 24 hours of no fever (without taking a fever reducer).  You should wear a mask or cloth face covering over your nose and mouth if you must be around other people or animals, including pets (even at home). Try to stay at least 6 feet away from other people. This will protect the people around you.  Please continue good preventive care measures, including:  frequent hand-washing, avoid touching your face, cover coughs/sneezes, stay out of crowds and keep a 6 foot distance from others.  COVID-19 is a respiratory illness with symptoms that are similar to the flu. Symptoms are typically mild to moderate, but there have been cases of severe illness and death due to the virus.   The following symptoms may appear 2-14 days after exposure: . Fever . Cough . Shortness of breath or difficulty breathing . Chills . Repeated shaking with chills . Muscle pain . Headache . Sore throat . New loss of taste or smell . Fatigue . Congestion or runny nose . Nausea or vomiting . Diarrhea  Go to the nearest hospital ED for assessment if fever/cough/breathlessness are severe or illness seems like a threat to life.  It is vitally important that if you feel that you have an infection such as this virus or any other virus that you stay home and away from places where you may spread it to others.  You should avoid contact with people age 65 and older.   You can use medication such as delsym if develop a cough  You may also take acetaminophen (Tylenol) as needed for fever.  Reduce your    risk of any infection by using the same precautions used for avoiding the common cold or flu:  . Wash your hands often with soap and warm water for at least 20 seconds.  If soap and water are not readily available, use an alcohol-based hand sanitizer with at least 60% alcohol.  . If coughing or sneezing, cover your mouth and nose by coughing or sneezing into the elbow areas of your shirt or coat, into a tissue or into your sleeve (not your hands). . Avoid shaking hands with others and consider head nods or verbal greetings only. . Avoid touching your eyes, nose, or mouth with unwashed hands.  . Avoid close contact with people who are sick. . Avoid places or events with large numbers of people in one location, like concerts or sporting events. . Carefully consider travel plans you have or are making. . If you are planning any travel outside or inside the [place], visit the CDC's Travelers' Health webpage for the latest health notices. . If you have some symptoms but not all symptoms, continue to monitor at home and seek medical attention if your symptoms worsen. . If you are having a medical emergency, call 911.  HOME CARE . Only take medications as instructed by your medical team. . Drink plenty of fluids and get plenty of rest. . A steam or ultrasonic humidifier can help if you have congestion.   GET HELP RIGHT AWAY IF YOU HAVE EMERGENCY WARNING SIGNS** FOR COVID-19. If you or someone is showing any of these signs seek emergency medical care immediately. Call 911 or proceed to your closest emergency facility if: . You develop worsening high fever. . Trouble breathing . Bluish lips or face . Persistent pain or pressure in the chest . New confusion . Inability to wake or stay awake . You cough up blood. . Your symptoms become more severe  **This list is not all possible symptoms. Contact your medical provider for any symptoms that are sever or concerning to you.  MAKE SURE YOU   Understand  these instructions.  Will watch your condition.  Will get help right away if you are not doing well or get worse.  Your e-visit answers were reviewed by a board certified advanced clinical practitioner to complete your personal care plan.  Depending on the condition, your plan could have included both over the counter or prescription medications.  If there is a problem please reply once you have received a response from your provider.  Your safety is important to us.  If you have drug allergies check your prescription carefully.    You can use MyChart to ask questions about today's visit, request a non-urgent call back, or ask for a work or school excuse for 24 hours related to this e-Visit. If it has been greater than 24 hours you will need to follow up with your provider, or enter a new e-Visit to address those concerns. You will get an e-mail in the next two days asking about your experience.  I hope that your e-visit has been valuable and will speed your recovery. Thank you for using e-visits.   5-10 minutes spent reviewing and documenting in chart.  

## 2019-12-29 ENCOUNTER — Other Ambulatory Visit: Payer: BC Managed Care – PPO

## 2019-12-29 ENCOUNTER — Ambulatory Visit: Payer: BC Managed Care – PPO | Attending: Internal Medicine

## 2019-12-29 DIAGNOSIS — Z20822 Contact with and (suspected) exposure to covid-19: Secondary | ICD-10-CM

## 2019-12-30 LAB — NOVEL CORONAVIRUS, NAA: SARS-CoV-2, NAA: NOT DETECTED

## 2020-01-29 ENCOUNTER — Ambulatory Visit: Payer: BC Managed Care – PPO

## 2020-02-02 ENCOUNTER — Ambulatory Visit: Payer: BC Managed Care – PPO

## 2020-02-02 ENCOUNTER — Ambulatory Visit: Payer: BC Managed Care – PPO | Attending: Internal Medicine

## 2020-02-02 DIAGNOSIS — Z23 Encounter for immunization: Secondary | ICD-10-CM | POA: Insufficient documentation

## 2020-02-25 ENCOUNTER — Ambulatory Visit: Payer: BC Managed Care – PPO

## 2020-03-03 ENCOUNTER — Ambulatory Visit: Payer: BC Managed Care – PPO | Attending: Internal Medicine

## 2020-03-03 DIAGNOSIS — Z20822 Contact with and (suspected) exposure to covid-19: Secondary | ICD-10-CM

## 2020-03-04 LAB — NOVEL CORONAVIRUS, NAA: SARS-CoV-2, NAA: DETECTED — AB

## 2020-04-21 ENCOUNTER — Other Ambulatory Visit: Payer: Self-pay

## 2020-04-21 ENCOUNTER — Telehealth (INDEPENDENT_AMBULATORY_CARE_PROVIDER_SITE_OTHER): Payer: BC Managed Care – PPO | Admitting: Family Medicine

## 2020-04-21 VITALS — Wt 153.0 lb

## 2020-04-21 DIAGNOSIS — N393 Stress incontinence (female) (male): Secondary | ICD-10-CM

## 2020-04-21 DIAGNOSIS — G8929 Other chronic pain: Secondary | ICD-10-CM | POA: Diagnosis not present

## 2020-04-21 DIAGNOSIS — M545 Low back pain, unspecified: Secondary | ICD-10-CM

## 2020-04-21 NOTE — Patient Instructions (Signed)
° ° ° °  If you have lab work done today you will be contacted with your lab results within the next 2 weeks.  If you have not heard from us then please contact us. The fastest way to get your results is to register for My Chart. ° ° °IF you received an x-ray today, you will receive an invoice from [facility]. Please contact [facility] at [phone] with questions or concerns regarding your invoice.  ° °IF you received labwork today, you will receive an invoice from [facility]. Please contact [facility] at [phone] with questions or concerns regarding your invoice.  ° °Our billing staff will not be able to assist you with questions regarding bills from these companies. ° °You will be contacted with the lab results as soon as they are available. The fastest way to get your results is to activate your My Chart account. Instructions are located on the last page of this paperwork. If you have not heard from us regarding the results in 2 weeks, please contact this office. °  ° ° ° °

## 2020-04-21 NOTE — Progress Notes (Signed)
Established Patient Office Visit  Subjective:  Patient ID: Jasmine Bowen, female    DOB: October 03, 1976  Age: 44 y.o. MRN: [id number]  Virtual Visit via Video Note  I connected with Jasmine Bowen on 04/21/20 at  5:00 PM EDT by a video enabled telemedicine application and verified that I am speaking with the correct person using two identifiers.  Location: Patient: work Provider: home   I discussed the limitations of evaluation and management by telemedicine and the availability of in person appointments. The patient expressed understanding and agreed to proceed.      I discussed the assessment and treatment plan with the patient. The patient was provided an opportunity to ask questions and all were answered. The patient agreed with the plan and demonstrated an understanding of the instructions.   The patient was advised to call back or seek an in-person evaluation if the symptoms worsen or if the condition fails to improve as anticipated.  I provided 15 minutes of non-face-to-face time during this encounter.   Doristine Bosworth, MD   CC:  Chief Complaint  Patient presents with  . Back Pain    pt had discussed pelvic floor therapy with you regarding this previously and is now intrested in revisiting this. pt [facility] if possible     HPI Jasmine Bowen presents for   Patient reports that pelvic floor rehab helped her and she feels like her stress incontinence is better. She states that she has low back pain. She states that it is chronic and she would like to see PT at [facility] out patient rehab.   She denies numbness and tingling.   Past Medical History:  Diagnosis Date  . Delayed immunizations 09/19/2019  . Medical history non-contributory     Past Surgical History:  Procedure Laterality Date  . WISDOM TOOTH EXTRACTION      Family History  Problem Relation Age of Onset  . Cancer Father        prostate  cancer  . Heart disease Father   . Hyperlipidemia Father   . Hypertension Father   . Diabetes Maternal Grandmother   . Heart disease Paternal Grandfather   . Hypertension Paternal Grandfather   . Hyperlipidemia Paternal Grandfather   . Stroke Paternal Grandfather     Social History   Socioeconomic History  . Marital status: Married    Spouse name: Not on file  . Number of children: Not on file  . Years of education: Not on file  . Highest education level: Not on file  Occupational History  . Not on file  Tobacco Use  . Smoking status: Never Smoker  . Smokeless tobacco: Never Used  Substance and Sexual Activity  . Alcohol use: No  . Drug use: No  . Sexual activity: Yes  Other Topics Concern  . Not on file  Social History Narrative  . Not on file   Social Determinants of Health   Financial Resource Strain: Low Risk   . Difficulty of Paying Living Expenses: Not hard at all  Food Insecurity: No Food Insecurity  . Worried About Programme researcher, broadcasting/film/video in the Last Year: Never true  . Ran Out of Food in the Last Year: Never true  Transportation Needs: No Transportation Needs  . Lack of Transportation (Medical): No  . Lack of Transportation (Non-Medical): No  Physical Activity: Sufficiently Active  . Days of Exercise per Week: 3 days  .  Minutes of Exercise per Session: 90 min  Stress: No Stress Concern Present  . Feeling of Stress : Not at all  Social Connections: Not Isolated  . Frequency of Communication with Friends and Family: More than three times a week  . Frequency of Social Gatherings with Friends and Family: More than three times a week  . Attends Religious Services: More than 4 times per year  . Active Member of Clubs or Organizations: Yes  . Attends Banker Meetings: More than 4 times per year  . Marital Status: Married  Catering manager Violence: Not At Risk  . Fear of Current or Ex-Partner: No  . Emotionally Abused: No  . Physically Abused: No   . Sexually Abused: No    No outpatient medications prior to visit.   No facility-administered medications prior to visit.    No Known Allergies  ROS Review of Systems Review of Systems  Constitutional: Negative for activity change, appetite change, chills and fever.  HENT: Negative for congestion, nosebleeds, trouble swallowing and voice change.   Respiratory: Negative for cough, shortness of breath and wheezing.   Gastrointestinal: Negative for diarrhea, nausea and vomiting.  Genitourinary: Negative for difficulty urinating, dysuria, flank pain and hematuria.  Musculoskeletal: see hpi Neurological: Negative for dizziness, speech difficulty, light-headedness and numbness.  See HPI. All other review of systems negative.     Objective:    Physical Exam  Wt 153 lb (69.4 kg)   BMI 23.96 kg/m  Wt Readings from Last 3 Encounters:  04/21/20 153 lb (69.4 kg)  09/19/19 158 lb 6.4 oz (71.8 kg)  07/08/19 158 lb 12.8 oz (72 kg)   Physical Exam  Constitutional: Oriented to person, place, and time. Appears well-developed and well-nourished.  HENT:  Head: Normocephalic and atraumatic.  Eyes: Conjunctivae and EOM are normal.  Pulmonary/Chest: Effort normal  Neurological: Is alert and oriented to person, place, and time.  Psychiatric: Has a normal mood and affect. Behavior is normal. Judgment and thought content normal.    There are no preventive care reminders to display for this patient.  There are no preventive care reminders to display for this patient.  Lab Results  Component Value Date   TSH 2.450 07/08/2019   Lab Results  Component Value Date   WBC 8.2 03/14/2014   HGB 9.4 (L) 03/14/2014   HCT 26.2 (L) 03/14/2014   MCV 92.6 03/14/2014   PLT 131 (L) 03/14/2014   Lab Results  Component Value Date   NA 138 07/08/2019   K 4.2 07/08/2019   CO2 21 07/08/2019   GLUCOSE 100 (H) 07/08/2019   BUN 6 07/08/2019   CREATININE 0.72 07/08/2019   BILITOT 0.4 07/08/2019    ALKPHOS 53 07/08/2019   AST 15 07/08/2019   ALT 10 07/08/2019   PROT 6.8 07/08/2019   ALBUMIN 4.3 07/08/2019   CALCIUM 9.4 07/08/2019   Lab Results  Component Value Date   CHOL 136 07/08/2019   Lab Results  Component Value Date   HDL 33 (L) 07/08/2019   Lab Results  Component Value Date   LDLCALC 70 07/08/2019   Lab Results  Component Value Date   TRIG 166 (H) 07/08/2019   Lab Results  Component Value Date   CHOLHDL 4.1 07/08/2019   Lab Results  Component Value Date   HGBA1C 4.9 07/08/2019      Assessment & Plan:   Problem List Items Addressed This Visit    None    Visit Diagnoses  Chronic bilateral low back pain without sciatica    -  Primary   Relevant Orders   Ambulatory referral to Physical Therapy   Stress incontinence of urine       Relevant Orders   Ambulatory referral to Physical Therapy    referral placed Continue PT for urinary incontinence and add on for low back pain  No orders of the defined types were placed in this encounter.   Follow-up: No follow-ups on file.    Forrest Moron, MD

## 2020-05-13 ENCOUNTER — Other Ambulatory Visit: Payer: Self-pay

## 2020-05-13 ENCOUNTER — Ambulatory Visit: Payer: BC Managed Care – PPO | Attending: Family Medicine

## 2020-05-13 DIAGNOSIS — M533 Sacrococcygeal disorders, not elsewhere classified: Secondary | ICD-10-CM | POA: Insufficient documentation

## 2020-05-13 DIAGNOSIS — M62830 Muscle spasm of back: Secondary | ICD-10-CM | POA: Diagnosis present

## 2020-05-13 DIAGNOSIS — R278 Other lack of coordination: Secondary | ICD-10-CM | POA: Insufficient documentation

## 2020-05-13 NOTE — Patient Instructions (Signed)
Stabilization: Diaphragmatic Breathing    Lie with knees bent, feet flat. Place one hand on stomach, other on chest. Breathe deeply through nose, lifting belly hand without any motion of hand on chest.  Do this for ~ 5 min. Per night and throughout the day if you feel stressed and whenever you remember.

## 2020-05-13 NOTE — Therapy (Signed)
[place] MAIN [facility] SERVICES [address] [place], [place], [address] Phone: [phone]   Fax:  [phone]  Physical Therapy Evaluation  The patient has been informed of current processes in place at Outpatient Rehab to protect patients from Covid-19 exposure including social distancing, schedule modifications, and new cleaning procedures. After discussing their particular risk with a therapist based on the patient's personal risk factors, the patient has decided to proceed with in-person therapy.  Patient Details  Name: Jasmine Bowen MRN: [id number] Date of Birth: October 20, 1976 No data recorded  Encounter Date: 05/13/2020  PT End of Session - 05/13/20 1441    Visit Number  1    Number of Visits  10    Date for PT Re-Evaluation  07/22/20    Authorization Type  [employer]    Authorization Time Period  05/13/20 through 07/22/20    Authorization - Visit Number  1    Authorization - Number of Visits  10    Progress Note Due on Visit  10    PT Start Time  0258    PT Stop Time  1415    PT Time Calculation (min)  60 min    Activity Tolerance  Patient tolerated treatment well;No increased pain    Behavior During Therapy  WFL for tasks assessed/performed       Past Medical History:  Diagnosis Date  . Delayed immunizations 09/19/2019  . Medical history non-contributory     Past Surgical History:  Procedure Laterality Date  . WISDOM TOOTH EXTRACTION      There were no vitals filed for this visit.  Pelvic Floor Physical Therapy Evaluation and Assessment  SCREENING  Falls in last 6 mo: no   Red Flags:  Have you had any night sweats? no Unexplained weight loss? no Saddle anesthesia? no Unexplained changes in bowel or bladder habits? no  SUBJECTIVE  Patient reports: Had PT from September through early November in [place], worked on biofeedback for SUI. It helped some. Is having LBP and has had some return of her SUI especially with  jumping jacks, etc.  Back pain is chronic, began in ~ 2003 Exercise seems to make it worse but it has been even worse over the last year when she has been less active. Has sometimes had to take muscle relaxers to help relax it. Is very into African dance. This last year has been worse.  Pain is near the R>L SIJ and shoots outward, hurts to get out of bed "feels like an old lady" Got Acupuncture for her back pain on Monday which helped a lot. Went to [facility] in [place].   Precautions:  none  Social/Family/Vocational History:   Works full time  Recent Procedures/Tests/Findings:  none  Obstetrical History: Has 3 children, youngest is 57 years old 3rd degree tearing with first  Gynecological History: none  Urinary History: SUI with jumping activities, h/o urge incontinence but it is better since prior PT  Gastrointestinal History: Having a BM ~ 3 times per day  Sexual activity/pain: No pain  Location of pain: R>L SIJ and laterally Current pain:  0/10  Max pain:  8/10 Least pain:  0/10 Nature of pain: Dull with really strong ache/striking   Patient Goals: Be able to exercise/dance without pain or SUI.   OBJECTIVE  Posture/Observations:  Sitting:  Standing: slight hyperlordosis, R PSIS high, R hip high during forward bend. R lumbar paraspinals more prominent.  Palpation/Segmental Motion/Joint Play: TTP to R>L hip-flexors and  multifidus at T-L and L-S junctions, R pectineus, and Glute Med  Special tests:   Leg-length: RLE long by 1.5cm Supine-to-long-sit: RLE long in both (slightly less-long in supine)  Range of Motion/Flexibilty:  Spine: to knee, L SB 2 fingers past knee with tightness on R, ROT WNL, Forward bend ~ 4 inches from floor R hip high, R paraspinals prominent. Hips:   Strength/MMT: Deferred to follow-up LE MMT  LE MMT Left Right  Hip flex:  (L2) /5 /5  Hip ext: /5 /5  Hip abd: /5 /5  Hip add: /5 /5  Hip IR /5 /5  Hip ER /5 /5      Abdominal:  Palpation: TTP to R>L hip-flexors and R pectineus Diastasis: ~ 1 finger when activating deep-core  Pelvic Floor External Exam: Deferred to follow-up Introitus Appears:  Skin integrity:  Palpation: Cough: Prolapse visible?: Scar mobility:  Internal Vaginal Exam: Strength (PERF):  Symmetry: Palpation: Prolapse:   Internal Rectal Exam: Strength (PERF): Symmetry: Palpation: Prolapse:   Gait Analysis: Deferred to follow-up   Pelvic Floor Outcome Measures: FOTO PFDI urinary: 4 Urinary problem: 60  INTERVENTIONS THIS SESSION: Self-care: Educated on the structure and function of the pelvic floor in relation to their symptoms as well as the POC, and initial HEP in order to set patient expectations and understanding from which we will build on in the future sessions. Educated on and practiced diaphragmatic breathing to allow for spasm reduction technique efficiency and improve deep-core coordination.   Total time: 60 min.                    Objective measurements completed on examination: See above findings.                PT Short Term Goals - 05/13/20 1448      PT SHORT TERM GOAL #1   Title  Patient will demonstrate improved pelvic alignment and balance of musculature surrounding the pelvis to facilitate decreased PFM spasms and decrease LB pain.    Baseline  RLE long, spasms surrounding R>L hip/LB    Time  5    Period  Weeks    Status  New    Target Date  06/17/20      PT SHORT TERM GOAL #2   Title  Pt. will be compliant with wearing heel-lift during ~90% of the day while being active to help prevent return of spasms and malalignment.    Baseline  RLE long by ~ 1.5 cm    Time  5    Period  Weeks    Status  New    Target Date  06/17/20      PT SHORT TERM GOAL #3   Title  Patient will demonstrate HEP x1 in the clinic to demonstrate understanding and proper form to allow for further improvement.    Baseline  Pt.  lacks knowledge of what therapeutic exercises will help her decrease SUI and LBP    Time  5    Period  Weeks    Status  New    Target Date  06/17/20        PT Long Term Goals - 05/13/20 1451      PT LONG TERM GOAL #1   Title  Patient will report no episodes of SUI over the course of the prior two weeks to demonstrate improved functional ability.    Baseline  Pt. having SUI with jumping jacks/running etc.    Time  10  Period  Weeks    Status  New    Target Date  07/22/20      PT LONG TERM GOAL #2   Title  Patient will describe pain no greater than 1/10 during moderate to high intensity exercise for 30-45 min. and when getting out of bed to demonstrate improved functional ability.    Baseline  Pt. has increased pain with exercise and when rising from laying flat.    Time  10    Period  Weeks    Status  New    Target Date  07/22/20      PT LONG TERM GOAL #3   Title  Pt. will improve in FOTO score by 10 points (or maximal score) to demonstrate improved function.    Baseline  FOTO PFDI urinary: 4 Urinary problem: 60    Time  10    Period  Weeks    Status  New    Target Date  07/22/20             Plan - 05/13/20 1442    Clinical Impression Statement  Pt. is a 44 y/o female who presents today with cheif c/o SUI with Jumping/exercise and chronic LBP. 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 She will benefit from skilled pelvic health PT to address the noted deficits and to continue to assess for and address any other potential causes of Sx.    Examination-Activity Limitations  Transfers;Continence    Stability/Clinical Decision Making  Stable/Uncomplicated    Clinical Decision Making  Low    Rehab Potential  Excellent    PT Frequency  1x / week    PT Duration  --   10   PT  Treatment/Interventions  ADLs/Self Care Home Management;Biofeedback;Electrical Stimulation;Moist Heat;Ultrasound;Traction;Functional mobility training;Therapeutic activities;Neuromuscular re-education;Therapeutic exercise;Patient/family education;Manual techniques;Dry needling;Taping;Spinal Manipulations;Joint Manipulations    PT Next Visit Plan  TPDN to R hip-flexor and QL PRN, sacral mobility, give heel-lift for LLE.    PT Home Exercise Plan  diaphragmatic breathing    Consulted and Agree with Plan of Care  Patient       Patient will benefit from skilled therapeutic intervention in order to improve the following deficits and impairments:  Increased muscle spasms, Pain, Postural dysfunction, Impaired flexibility, Increased fascial restricitons, Decreased strength, Decreased coordination  Visit Diagnosis: Sacrococcygeal disorders, not elsewhere classified  Muscle spasm of back  Other lack of coordination     Problem List Patient Active Problem List   Diagnosis Date Noted  . Delayed immunizations 09/19/2019  . Active labor 03/12/2014   Jasmine Bowen DPT, ATC Jasmine Bowen 05/13/2020, 3:05 PM  [facility] MAIN [facility] SERVICES [address] [place], [place], [address] Phone: [phone]   Fax:  [phone]  Name: Jasmine Bowen MRN: [id number] Date of Birth: June 13, 1976

## 2020-05-20 ENCOUNTER — Other Ambulatory Visit: Payer: Self-pay

## 2020-05-20 ENCOUNTER — Ambulatory Visit: Payer: BC Managed Care – PPO | Attending: Family Medicine

## 2020-05-20 DIAGNOSIS — R278 Other lack of coordination: Secondary | ICD-10-CM | POA: Diagnosis present

## 2020-05-20 DIAGNOSIS — M62830 Muscle spasm of back: Secondary | ICD-10-CM | POA: Diagnosis present

## 2020-05-20 DIAGNOSIS — M533 Sacrococcygeal disorders, not elsewhere classified: Secondary | ICD-10-CM

## 2020-05-20 NOTE — Therapy (Addendum)
[facility] MAIN [facility] SERVICES [address] [place], [place], [address] Phone: [phone]   Fax:  [phone]  Physical Therapy Treatment  The patient has been informed of current processes in place at Outpatient Rehab to protect patients from Covid-19 exposure including social distancing, schedule modifications, and new cleaning procedures. After discussing their particular risk with a therapist based on the patient's personal risk factors, the patient has decided to proceed with in-person therapy.   Patient Details  Name: Jasmine Bowen MRN: [id number] Date of Birth: 1976/08/15 No data recorded  Encounter Date: 05/20/2020  PT End of Session - 05/24/20 0839    Visit Number  2    Number of Visits  10    Date for PT Re-Evaluation  07/22/20    Authorization Type  BCBS    Authorization Time Period  05/13/20 through 07/22/20    Authorization - Visit Number  2    Authorization - Number of Visits  10    Progress Note Due on Visit  10    PT Start Time  1300    PT Stop Time  1405    PT Time Calculation (min)  65 min    Activity Tolerance  Patient tolerated treatment well;No increased pain    Behavior During Therapy  WFL for tasks assessed/performed       Past Medical History:  Diagnosis Date  . Delayed immunizations 09/19/2019  . Medical history non-contributory     Past Surgical History:  Procedure Laterality Date  . WISDOM TOOTH EXTRACTION      There were no vitals filed for this visit.    Pelvic Floor Physical Therapy Treatment Note  SCREENING  Changes in medications, allergies, or medical history?: none    SUBJECTIVE  Patient reports: Her back has been bad all week, slept better one night when she slept in her daughter's bed and had a pillow between her knees. Went to [employer] and did a lot of walking, that seems to be what irritated it.  Precautions:  none  Pain update: Location of pain: R>L SIJ and laterally Current  pain: 6/10  Max pain: 8/10 Least pain: 6/10 Nature of pain:Dull with really strong ache/striking   Patient Goals: Be able to exercise/dance without pain or SUI.   OBJECTIVE  Changes in: Posture/Observations:    Range of Motion/Flexibilty:    Strength/MMT:  LE MMT:  Pelvic floor:  Abdominal:   Palpation: TTP to R iliacus and QL  Gait Analysis:  INTERVENTIONS THIS SESSION: Self-care: Educated on and given a heel-lift with instructions on how to slowly introduce to help prevent increased pain.  Therex: Educated on and practiced side-stretch and hip-flexor stretch To maintain and improve muscle length and allow for improved balance of musculature for long-term symptom relief. Educated on and practiced seated pelvic tilts to improve strength of muscles opposing tight musculature to allow reciprocal inhibition to improve balance of musculature surrounding the pelvis and improve overall posture for optimal musculature length-tension relationship and function.  Manual: Performed TP release and STM to R iliacus and QL to decrease spasm and pain and allow for improved balance of musculature for improved function and decreased symptoms.  Dry-needle: Performed TPDN with a .30x63mm needle and standard approach as described below to decrease spasm and pain and allow for improved balance of musculature for improved function and decreased symptoms.  Total time: 65 min.  Trigger Point Dry Needling - 05/24/20 0001    Consent Given?  Yes    Education Handout Provided  No    Muscles Treated Back/Hip  Iliacus;Quadratus lumborum    Iliacus Response  Twitch response elicited;Palpable increased muscle length    Quadratus Lumborum Response  Twitch response elicited;Palpable increased muscle length             PT Short Term Goals - 05/13/20 1448      PT SHORT TERM GOAL #1   Title  Patient will demonstrate improved pelvic alignment and balance  of musculature surrounding the pelvis to facilitate decreased PFM spasms and decrease LB pain.    Baseline  RLE long, spasms surrounding R>L hip/LB    Time  5    Period  Weeks    Status  New    Target Date  06/17/20      PT SHORT TERM GOAL #2   Title  Pt. will be compliant with wearing heel-lift during ~90% of the day while being active to help prevent return of spasms and malalignment.    Baseline  RLE long by ~ 1.5 cm    Time  5    Period  Weeks    Status  New    Target Date  06/17/20      PT SHORT TERM GOAL #3   Title  Patient will demonstrate HEP x1 in the clinic to demonstrate understanding and proper form to allow for further improvement.    Baseline  Pt. lacks knowledge of what therapeutic exercises will help her decrease SUI and LBP    Time  5    Period  Weeks    Status  New    Target Date  06/17/20        PT Long Term Goals - 05/13/20 1451      PT LONG TERM GOAL #1   Title  Patient will report no episodes of SUI over the course of the prior two weeks to demonstrate improved functional ability.    Baseline  Pt. having SUI with jumping jacks/running etc.    Time  10    Period  Weeks    Status  New    Target Date  07/22/20      PT LONG TERM GOAL #2   Title  Patient will describe pain no greater than 1/10 during moderate to high intensity exercise for 30-45 min. and when getting out of bed to demonstrate improved functional ability.    Baseline  Pt. has increased pain with exercise and when rising from laying flat.    Time  10    Period  Weeks    Status  New    Target Date  07/22/20      PT LONG TERM GOAL #3   Title  Pt. will improve in FOTO score by 10 points (or maximal score) to demonstrate improved function.    Baseline  FOTO PFDI urinary: 4 Urinary problem: 60    Time  10    Period  Weeks    Status  New    Target Date  07/22/20            Plan - 05/24/20 0840    Clinical Impression Statement  Pt. Responded well to all interventions today,  demonstrating decreased spasm and pain and improved pelvic alignment with addition of heel-lift as well as understanding and correct performance of all education and exercises provided today. They will continue to benefit from skilled physical therapy  to work toward remaining goals and maximize function as well as decrease likelihood of symptom increase or recurrence.    PT Next Visit Plan  TPDN to R hip-flexor and QL PRN, sacral mobility, give heel-lift for LLE.    PT Home Exercise Plan  diaphragmatic breathing, side-stretch, hip-flexor stretch, heel-lift for LLE, seated pelvic tilts    Consulted and Agree with Plan of Care  Patient       Patient will benefit from skilled therapeutic intervention in order to improve the following deficits and impairments:     Visit Diagnosis: Sacrococcygeal disorders, not elsewhere classified  Muscle spasm of back  Other lack of coordination     Problem List Patient Active Problem List   Diagnosis Date Noted  . Delayed immunizations 09/19/2019  . Active labor 03/12/2014   Cleophus Molt DPT, ATC Cleophus Molt 05/24/2020, 8:43 AM  [facility] [facility] MAIN [facility] SERVICES [address] [place], [place], [address] Phone: [phone]   Fax:  [phone]  Name: Jasmine Bowen MRN: [id number] Date of Birth: May 29, 1976

## 2020-05-20 NOTE — Patient Instructions (Addendum)
   Do 2 sets of 15 tilts per day. Breathe in when you tilt forward (A) and out when you tuck under (B).      Sit up with a tall spine and cross one leg over the other knee. Hinge from the hip and lean until you can feel a stretch through your bottom hold for 5 deep breaths and then switch sides. Repeat 2-3 times on each side.    As you start wearing your heel-lift only wear it for an hour the first day and increase by an hour each day so you can allow for the body to adapt to the change easily without much pain. If your pain increases by more than 1-2 points, back off slightly or slow down how quickly you increase your wear time. Once you reach a full day of wear, use it as much as possible forever, even in house-shoes or flip-flops if necessary to keep yourself from reverting to bad pelvic and spinal alignment and having symptoms return.    Adjust-a-lift heel lift Can be found at [email or web address]   Hold for 30 seconds (5 deep breaths) and repeat 2-3 times on each side once a day      Bring both knees up to your chest and then hold the one farthest from the edge of the table/bed and let the other relax toward the floor until stretch is felt through the front of the hip.   Hold-relax: Gently lift the knee of the down leg up ~ 1/2 and inch and hold for 5 seconds, then let it relax all the way for a second and repeat 4 more times to decrease resting tension in the muscle.   Stretch: Let the leg relax and feel a stretch across the front of the hip/thigh as you take belly breaths. Hold for __5__ deep belly breaths. Relax. Repeat __2-3__ times per side.    Do this __1-2__ times per day.    Flexors, Lunge  Hip Flexor Stretch: Proposal Pose    Maintain pelvic tuck under, lift pubic bone toward navel. Engage posterior hip muscles (firm glute muscles of leg in back position) and shift forward until you feel stretch on front of leg that is down. To increase stretch, maintain balance and ease  hips forward. You may use one hand on a chair for balance if needed. Hold for __5__ breaths. Repeat __2-3__ times each leg.  Do _1-2__ times per day.   Sit your bottom at the edge of the bed, pull your knee into your chest and keep it close as you lie back, feel the stretch down the front of the leg that is down. Hold for 5 deep breaths, repease 2-3 times for each side 1-2 times per day. You may use a towel to help hold the knee to your chest if needed.

## 2020-05-27 ENCOUNTER — Ambulatory Visit: Payer: BC Managed Care – PPO

## 2020-05-27 ENCOUNTER — Other Ambulatory Visit: Payer: Self-pay

## 2020-05-27 DIAGNOSIS — M62830 Muscle spasm of back: Secondary | ICD-10-CM

## 2020-05-27 DIAGNOSIS — M533 Sacrococcygeal disorders, not elsewhere classified: Secondary | ICD-10-CM | POA: Diagnosis not present

## 2020-05-27 DIAGNOSIS — R278 Other lack of coordination: Secondary | ICD-10-CM

## 2020-05-27 NOTE — Patient Instructions (Addendum)
° °   This is The QL muscle  To perform release on this muscle, start by getting into this position by bridging the hips up and then slowly lowering your back, then your butt down to lengthen the low back then put the ball under you where you feel the tender spot and roll to the same side slightly to add pressure as needed. Hold still and take deep breaths until the pain is at least 50% less or, ideally, just pressure.   This is your piriformis    To release this muscle start in this position with your ankle crossed over the opposite knee. Place the tennis ball under your buttock where the tender spot is and then slightly roll your weight to the same side to put just enough pressure that it is uncomfortable. Hold and take deep breaths until the pain is at least 50% less or, ideally ,just pressure.   This is your Gluteus Medius and Minimus  To perform release on this muscle, start by getting into this position by bridging the hips up and then slowly lowering your back, then your butt down to lengthen the low back then put the ball under you where you feel the tender spot and roll to the same side slightly to add pressure as needed. Hold still and take deep breaths until the pain is at least 50% less or, ideally, just pressure.   These are your deep hip-flexor muscles. They are easiest to reach where they come together at the hip.   To perform release on this muscle, start by getting into this position by laying on your stomach then put the ball under you where you feel the tender spot and bring the opposite knee up/out to the side to add pressure as needed. Hold still and take deep breaths until the pain is at least 50% less or, ideally ,just pressure.     Do 2 sets of 15 tilts per day. Breathe in when you tilt forward (A) and out when you tuck under (B).

## 2020-05-27 NOTE — Therapy (Addendum)
[place] [facility] MAIN [facility] SERVICES [address] [place], [place], [address] Phone: [phone]   Fax:  [phone]  Physical Therapy Treatment  The patient has been informed of current processes in place at Outpatient Rehab to protect patients from Covid-19 exposure including social distancing, schedule modifications, and new cleaning procedures. After discussing their particular risk with a therapist based on the patient's personal risk factors, the patient has decided to proceed with in-person therapy.   Patient Details  Name: Jasmine Bowen MRN: [id number] Date of Birth: September 28, 1976 No data recorded  Encounter Date: 05/27/2020   PT End of Session - 05/27/20 1438    Visit Number 3    Number of Visits 10    Date for PT Re-Evaluation 07/22/20    Authorization Type BC[employer]    Authorization Time Period 05/13/20 through 07/22/20    Authorization - Visit Number 3    Authorization - Number of Visits 10    Progress Note Due on Visit 10    PT Start Time 1430    PT Stop Time 1530    PT Time Calculation (min) 60 min    Activity Tolerance Patient tolerated treatment well;No increased pain    Behavior During Therapy WFL for tasks assessed/performed           Past Medical History:  Diagnosis Date  . Delayed immunizations 09/19/2019  . Medical history non-contributory     Past Surgical History:  Procedure Laterality Date  . WISDOM TOOTH EXTRACTION      There were no vitals filed for this visit.   Pelvic Floor Physical Therapy Treatment Note  SCREENING  Changes in medications, allergies, or medical history?: none    SUBJECTIVE  Patient reports: Has had much less pain across the low back, feels more at SIJ B. Less issues with bed mobility and walking.   Precautions:  none  Pain update: Location of pain: R>L SIJ and laterally Current pain: 3/10  Max pain: 5/10 Least pain: /10 Nature of pain:Dull with really strong ache/striking    **no pain, just soreness to pressure following treatment.  Patient Goals: Be able to exercise/dance without pain or SUI.   OBJECTIVE  Changes in: Posture/Observations:    Range of Motion/Flexibilty:  Decreased fascial mobility around R sacral border and through SIJ.  Decreased mobility through T-L junction and lumbar spine  Strength/MMT:  LE MMT:  Pelvic floor:  Abdominal:   Palpation: TTP to R Piriformis  Gait Analysis:  INTERVENTIONS THIS SESSION: Manual: performed fascial mobility to posterior hip/LB and TP release to R Piriformis followed by PA mobs along the lumbar spine and across the T/L junction to decrease spasm and pain, increase fascial mobility, and allow for improved balance of musculature for improved function and decreased symptoms.  Self-care: Educated on and given a heel-lift with instructions on how to slowly introduce to help prevent increased pain.  Therex: Reviewed and practiced posterior pelvic tilts with TA and obliques to improve strength of muscles opposing tight musculature to allow reciprocal inhibition to improve balance of musculature surrounding the pelvis and improve overall posture for optimal musculature length-tension relationship and function. Educated on how to perform self TP release with a tennis ball to decrease spasm and pain and allow for improved balance of musculature for improved function and decreased symptoms.  Total time: 60 min.  PT Short Term Goals - 05/13/20 1448      PT SHORT TERM GOAL #1   Title Patient will demonstrate improved pelvic alignment and balance of musculature surrounding the pelvis to facilitate decreased PFM spasms and decrease LB pain.    Baseline RLE long, spasms surrounding R>L hip/LB    Time 5    Period Weeks    Status New    Target Date 06/17/20      PT SHORT TERM GOAL #2   Title Pt. will be compliant with wearing heel-lift during ~90% of  the day while being active to help prevent return of spasms and malalignment.    Baseline RLE long by ~ 1.5 cm    Time 5    Period Weeks    Status New    Target Date 06/17/20      PT SHORT TERM GOAL #3   Title Patient will demonstrate HEP x1 in the clinic to demonstrate understanding and proper form to allow for further improvement.    Baseline Pt. lacks knowledge of what therapeutic exercises will help her decrease SUI and LBP    Time 5    Period Weeks    Status New    Target Date 06/17/20             PT Long Term Goals - 05/13/20 1451      PT LONG TERM GOAL #1   Title Patient will report no episodes of SUI over the course of the prior two weeks to demonstrate improved functional ability.    Baseline Pt. having SUI with jumping jacks/running etc.    Time 10    Period Weeks    Status New    Target Date 07/22/20      PT LONG TERM GOAL #2   Title Patient will describe pain no greater than 1/10 during moderate to high intensity exercise for 30-45 min. and when getting out of bed to demonstrate improved functional ability.    Baseline Pt. has increased pain with exercise and when rising from laying flat.    Time 10    Period Weeks    Status New    Target Date 07/22/20      PT LONG TERM GOAL #3   Title Pt. will improve in FOTO score by 10 points (or maximal score) to demonstrate improved function.    Baseline FOTO PFDI urinary: 4 Urinary problem: 60    Time 10    Period Weeks    Status New    Target Date 07/22/20                 Plan - 05/27/20 1439    Clinical Impression Statement Pt. Responded well to all interventions today, demonstrating decreased fascial restriction, TTP and increased ROM as well as understanding and correct performance of all education and exercises provided today. They will continue to benefit from skilled physical therapy to work toward remaining goals and maximize function as well as decrease likelihood of symptom increase or recurrence.    PT Next Visit Plan TPDN to R hip-flexor and QL PRN, sacral mobility, give heel-lift for LLE.    PT Home Exercise Plan diaphragmatic breathing, side-stretch, hip-flexor stretch, heel-lift for LLE, seated pelvic tilts    Consulted and Agree with Plan of Care Patient           Patient will benefit from skilled therapeutic intervention in order to improve the following deficits and impairments:     Visit Diagnosis: Sacrococcygeal disorders,  not elsewhere classified  Muscle spasm of back  Other lack of coordination     Problem List Patient Active Problem List   Diagnosis Date Noted  . Delayed immunizations 09/19/2019  . Active labor 03/12/2014   Cleophus Molt DPT, ATC Cleophus Molt 05/28/2020, 12:29 PM  [place] [facility] MAIN [facility] SERVICES [address] [place], [place], [address] Phone: [phone]   Fax:  [phone]  Name: Jasmine Bowen MRN: [id number] Date of Birth: 08/26/1976

## 2020-06-01 NOTE — Progress Notes (Signed)
   Covid-19 Vaccination Clinic  Name:  Jasmine Bowen    MRN: [id number] DOB: [birth year]  06/01/2020  Jasmine Bowen was observed post Covid-19 immunization for 15 minutes without incident. She was provided with Vaccine Information Sheet and instruction to access the V-Safe system.   Jasmine Bowen was instructed to call 911 with any severe reactions post vaccine: Marland Kitchen Difficulty breathing  . Swelling of face and throat  . A fast heartbeat  . A bad rash all over body  . Dizziness and weakness

## 2020-06-03 ENCOUNTER — Other Ambulatory Visit: Payer: Self-pay

## 2020-06-03 ENCOUNTER — Ambulatory Visit: Payer: BC Managed Care – PPO

## 2020-06-03 DIAGNOSIS — M533 Sacrococcygeal disorders, not elsewhere classified: Secondary | ICD-10-CM | POA: Diagnosis not present

## 2020-06-03 DIAGNOSIS — M62830 Muscle spasm of back: Secondary | ICD-10-CM

## 2020-06-03 DIAGNOSIS — R278 Other lack of coordination: Secondary | ICD-10-CM

## 2020-06-03 NOTE — Patient Instructions (Signed)
Bridges    Exhale and pull the low back into the table before using the glutes to bridge your hips up off of the mat, just to the point that you can continue to keep the pelvis neutral. Inhale slightly at the top and then exhale to roll back down one spinal segment at a time, bring the upper then middle, then low back before finally letting the bottom relax down.  Do 2x10, 1 time per day.  Cat / Cow Flow    Exhale, press spine toward ceiling like a Halloween cat. Keeping strength in arms and abdominals, Inhale to soften spine through neutral and into cow pose. Open chest and arch back. Initiate movement between cat and cow at tailbone, one vertebrae at a time. Repeat __30__ times.

## 2020-06-03 NOTE — Therapy (Addendum)
[place] MAIN [facility] SERVICES [address] [place], [place], [address] Phone: [phone]   Fax:  [phone]  Physical Therapy Treatment  The patient has been informed of current processes in place at Outpatient Rehab to protect patients from Covid-19 exposure including social distancing, schedule modifications, and new cleaning procedures. After discussing their particular risk with a therapist based on the patient's personal risk factors, the patient has decided to proceed with in-person therapy.  Patient Details  Name: Jasmine Bowen MRN: [id number] Date of Birth: 05-Oct-1976 No data recorded  Encounter Date: 06/03/2020   PT End of Session - 06/03/20 1710    Visit Number 4    Number of Visits 10    Date for PT Re-Evaluation 07/22/20    Authorization Type BCBS    Authorization Time Period 05/13/20 through 07/22/20    Authorization - Visit Number 4    Authorization - Number of Visits 10    Progress Note Due on Visit 10    PT Start Time 1330    PT Stop Time 1430    PT Time Calculation (min) 60 min    Activity Tolerance Patient tolerated treatment well;No increased pain    Behavior During Therapy WFL for tasks assessed/performed           Past Medical History:  Diagnosis Date  . Delayed immunizations 09/19/2019  . Medical history non-contributory     Past Surgical History:  Procedure Laterality Date  . WISDOM TOOTH EXTRACTION      There were no vitals filed for this visit.   Pelvic Floor Physical Therapy Treatment Note  SCREENING  Changes in medications, allergies, or medical history?: none    SUBJECTIVE  Patient reports: Was good Friday after last treatment. Was wearing the heel lift for a long time on Saturday because it didn't feel bad and then Saturday evening she tried to do tennis ball release and then had increased pain for Sunday through Tuesday, yesterday was good again. Still not having pain "across", just "certain  places". Tried to use the tennis ball again Monday and Tuesday "may" have gotten some release.  Precautions:  none  Pain update: Location of pain: R>L SIJ and laterally Current pain: 3/10  Max pain: 5/10 Least pain: /10 Nature of pain:Dull with really strong ache/striking   **no pain, just soreness to pressure following treatment.  Patient Goals: Be able to exercise/dance without pain or SUI.   OBJECTIVE  Changes in: Posture/Observations:  ASIS and PSIS level with heel-lift in place. Hyperlordosis evident.  Range of Motion/Flexibilty:  Decreased mobility through ~T5-9  Strength/MMT:  LE MMT:  Pelvic floor:  Abdominal:   Palpation: TTP to B paraspinals and Multifidus from ~ T10-L2   Gait Analysis:  INTERVENTIONS THIS SESSION: Manual: performed TP release and STM to B paraspinals and Multifidus from ~ T10-L2 followed by grade 2-4 PA mobs to ~ T5-9 to decrease spasm and pain, increase fascial mobility, and allow for improved posture, ability to recruit core musculature, and balance of musculature for improved function and decreased symptoms.  Dry-needle: Performed TPDN with a .30x74mm needle and standard approach as described below to decrease spasm and pain and allow for improved balance of musculature for improved function and decreased symptoms.  Therex: Reviewed and practiced posterior pelvic tilts with TA and obliques and educated on and practiced pilates-style bridges and cat/cow with emphasis on low back to improve strength of muscles opposing tight musculature to improve HEP efficacy and allow reciprocal  inhibition to improve balance of musculature surrounding the pelvis and improve overall posture for optimal musculature length-tension relationship and function.   Total time: 60 min.                      Trigger Point Dry Needling - 06/03/20 0001    Consent Given? Yes    Education Handout Provided No    Muscles Treated Back/Hip Erector  spinae;Thoracic multifidi    Erector spinae Response Twitch response elicited;Palpable increased muscle length    Thoracic multifidi response Twitch response elicited;Palpable increased muscle length                  PT Short Term Goals - 05/13/20 1448      PT SHORT TERM GOAL #1   Title Patient will demonstrate improved pelvic alignment and balance of musculature surrounding the pelvis to facilitate decreased PFM spasms and decrease LB pain.    Baseline RLE long, spasms surrounding R>L hip/LB    Time 5    Period Weeks    Status New    Target Date 06/17/20      PT SHORT TERM GOAL #2   Title Pt. will be compliant with wearing heel-lift during ~90% of the day while being active to help prevent return of spasms and malalignment.    Baseline RLE long by ~ 1.5 cm    Time 5    Period Weeks    Status New    Target Date 06/17/20      PT SHORT TERM GOAL #3   Title Patient will demonstrate HEP x1 in the clinic to demonstrate understanding and proper form to allow for further improvement.    Baseline Pt. lacks knowledge of what therapeutic exercises will help her decrease SUI and LBP    Time 5    Period Weeks    Status New    Target Date 06/17/20             PT Long Term Goals - 05/13/20 1451      PT LONG TERM GOAL #1   Title Patient will report no episodes of SUI over the course of the prior two weeks to demonstrate improved functional ability.    Baseline Pt. having SUI with jumping jacks/running etc.    Time 10    Period Weeks    Status New    Target Date 07/22/20      PT LONG TERM GOAL #2   Title Patient will describe pain no greater than 1/10 during moderate to high intensity exercise for 30-45 min. and when getting out of bed to demonstrate improved functional ability.    Baseline Pt. has increased pain with exercise and when rising from laying flat.    Time 10    Period Weeks    Status New    Target Date 07/22/20      PT LONG TERM GOAL #3   Title Pt. will  improve in FOTO score by 10 points (or maximal score) to demonstrate improved function.    Baseline FOTO PFDI urinary: 4 Urinary problem: 60    Time 10    Period Weeks    Status New    Target Date 07/22/20                 Plan - 06/03/20 1711    Clinical Impression Statement Pt. Responded well to all interventions today, demonstrating improved thoracic mobility, decreased spasm and reduction of pain from 3/10 to 0/10,  increased deep-core control, as well as understanding and correct performance of all education and exercises provided today. They will continue to benefit from skilled physical therapy to work toward remaining goals and maximize function as well as decrease likelihood of symptom increase or recurrence.    PT Next Visit Plan review deep-core and progress as appropriate    PT Home Exercise Plan diaphragmatic breathing, side-stretch, hip-flexor stretch, heel-lift for LLE, seated pelvic tilts, pilates-style bridge and cat/cow with emphasis on low  back.    Consulted and Agree with Plan of Care Patient           Patient will benefit from skilled therapeutic intervention in order to improve the following deficits and impairments:     Visit Diagnosis: Sacrococcygeal disorders, not elsewhere classified  Muscle spasm of back  Other lack of coordination     Problem List Patient Active Problem List   Diagnosis Date Noted  . Delayed immunizations 09/19/2019  . Active labor 03/12/2014   Cleophus Molt DPT, ATC Cleophus Molt 06/03/2020, 5:38 PM  [place] [facility] MAIN [facility] SERVICES [address] [place], [place], [address] Phone: [phone]   Fax:  [phone]  Name: Jasmine Bowen MRN: [id number] Date of Birth: September 11, 1976

## 2020-06-10 ENCOUNTER — Ambulatory Visit: Payer: BC Managed Care – PPO

## 2020-06-10 ENCOUNTER — Other Ambulatory Visit: Payer: Self-pay

## 2020-06-10 DIAGNOSIS — M533 Sacrococcygeal disorders, not elsewhere classified: Secondary | ICD-10-CM | POA: Diagnosis not present

## 2020-06-10 DIAGNOSIS — M62830 Muscle spasm of back: Secondary | ICD-10-CM

## 2020-06-10 DIAGNOSIS — R278 Other lack of coordination: Secondary | ICD-10-CM

## 2020-06-10 NOTE — Therapy (Signed)
[place] MAIN [facility] SERVICES [address] [place], [place], [address] Phone: [phone]   Fax:  [phone]  Physical Therapy Treatment  The patient has been informed of current processes in place at Outpatient Rehab to protect patients from Covid-19 exposure including social distancing, schedule modifications, and new cleaning procedures. After discussing their particular risk with a therapist based on the patient's personal risk factors, the patient has decided to proceed with in-person therapy.   Patient Details  Name: Jasmine Bowen MRN: [id number] Date of Birth: 1976-04-16 No data recorded  Encounter Date: 06/10/2020   PT End of Session - 06/10/20 1434    Visit Number 5    Number of Visits 10    Date for PT Re-Evaluation 07/22/20    Authorization Type BCBS    Authorization Time Period 05/13/20 through 07/22/20    Authorization - Visit Number 5    Authorization - Number of Visits 10    Progress Note Due on Visit 10    PT Start Time 1330    PT Stop Time 1430    PT Time Calculation (min) 60 min    Activity Tolerance Patient tolerated treatment well;No increased pain    Behavior During Therapy WFL for tasks assessed/performed           Past Medical History:  Diagnosis Date  . Delayed immunizations 09/19/2019  . Medical history non-contributory     Past Surgical History:  Procedure Laterality Date  . WISDOM TOOTH EXTRACTION      There were no vitals filed for this visit.    Pelvic Floor Physical Therapy Treatment Note  SCREENING  Changes in medications, allergies, or medical history?: none    SUBJECTIVE  Patient reports: She has seen a huge difference in her pain, has been doing well sleeping. Has not had any "across the back" pain until today but she believes this is because "her cycle is on". Has been doing her stretches in the morning and evening. Started to feel like something is "pulling her muscle by a string" in  the L hip after wearing her heel lift one day after ~ 2 hours. Has not worn it for more than 2 hours at a time so far. Went to an "extreme step class" and it went fine.  Precautions:  none  Pain update: Location of pain: R lumbar paraspinals Current pain: 4/10  Max pain: 4/10 Least pain: 0/10 Nature of pain:Dull with really strong ache/striking   **0/10 following treatment  Patient Goals: Be able to exercise/dance without pain or SUI.   OBJECTIVE  Changes in: Posture/Observations:  ASIS and PSIS level with heel-lift in place. Hyperlordosis evident.  Range of Motion/Flexibilty:  Decreased mobility at R>L sacral border  Strength/MMT:  LE MMT:  Pelvic floor:  Abdominal:   Palpation: TTP to R paraspinals ~ T10-L2 and R piriformis  Gait Analysis:  INTERVENTIONS THIS SESSION: Manual: performed TP release and STM to R paraspinals ~ T10-L2 and R piriformis followed by grade 2-4 PA mobs to B sacral borders and MFR along R>L sacrum and coccyx to decrease spasm and pain, increase fascial mobility, and allow for improved posture, ability to recruit core musculature, and balance of musculature for improved function and decreased symptoms.  Performed TPDN with a .30x68mm needle and standard approach as described below to decrease spasm and pain and allow for improved balance of musculature for improved function and decreased symptoms.  Therex: Reviewed and practiced posterior pelvic tilts with TA and  obliques to improve strength of muscles opposing tight musculature to improve HEP efficacy and allow reciprocal inhibition to improve balance of musculature surrounding the pelvis and improve overall posture for optimal musculature length-tension relationship and function.   Total time: 60 min.                         Trigger Point Dry Needling - 06/10/20 0001    Consent Given? Yes    Education Handout Provided No    Muscles Treated Back/Hip Erector spinae     Dry Needling Comments Right    Erector spinae Response Twitch response elicited;Palpable increased muscle length                  PT Short Term Goals - 06/10/20 1435      PT SHORT TERM GOAL #1   Title Patient will demonstrate improved pelvic alignment and balance of musculature surrounding the pelvis to facilitate decreased PFM spasms and decrease LB pain.    Baseline RLE long, spasms surrounding R>L hip/LB    Time 5    Period Weeks    Status Achieved    Target Date 06/17/20      PT SHORT TERM GOAL #2   Title Pt. will be compliant with wearing heel-lift during ~90% of the day while being active to help prevent return of spasms and malalignment.    Baseline RLE long by ~ 1.5 cm    Time 5    Period Weeks    Status On-going    Target Date 06/17/20      PT SHORT TERM GOAL #3   Title Patient will demonstrate HEP x1 in the clinic to demonstrate understanding and proper form to allow for further improvement.    Baseline Pt. lacks knowledge of what therapeutic exercises will help her decrease SUI and LBP    Time 5    Period Weeks    Status Achieved    Target Date 06/17/20             PT Long Term Goals - 05/13/20 1451      PT LONG TERM GOAL #1   Title Patient will report no episodes of SUI over the course of the prior two weeks to demonstrate improved functional ability.    Baseline Pt. having SUI with jumping jacks/running etc.    Time 10    Period Weeks    Status New    Target Date 07/22/20      PT LONG TERM GOAL #2   Title Patient will describe pain no greater than 1/10 during moderate to high intensity exercise for 30-45 min. and when getting out of bed to demonstrate improved functional ability.    Baseline Pt. has increased pain with exercise and when rising from laying flat.    Time 10    Period Weeks    Status New    Target Date 07/22/20      PT LONG TERM GOAL #3   Title Pt. will improve in FOTO score by 10 points (or maximal score) to demonstrate  improved function.    Baseline FOTO PFDI urinary: 4 Urinary problem: 60    Time 10    Period Weeks    Status New    Target Date 07/22/20                 Plan - 06/10/20 1434    Clinical Impression Statement Pt. Responded well to all interventions today, demonstrating  improved sacral mobility, decreased spasm and pain and improved HEP performance as well as understanding and correct performance of all education and exercises provided today. They will continue to benefit from skilled physical therapy to work toward remaining goals and maximize function as well as decrease likelihood of symptom increase or recurrence.    PT Next Visit Plan review deep-core and progress as appropriate    PT Home Exercise Plan diaphragmatic breathing, side-stretch, hip-flexor stretch, heel-lift for LLE, seated pelvic tilts, pilates-style bridge and cat/cow with emphasis on low  back, hook-lying pelvic tilt/mini-marches and low back stretch.    Consulted and Agree with Plan of Care Patient           Patient will benefit from skilled therapeutic intervention in order to improve the following deficits and impairments:     Visit Diagnosis: Sacrococcygeal disorders, not elsewhere classified  Muscle spasm of back  Other lack of coordination     Problem List Patient Active Problem List   Diagnosis Date Noted  . Delayed immunizations 09/19/2019  . Active labor 03/12/2014   Cleophus Molt DPT, ATC Cleophus Molt 06/10/2020, 4:32 PM  [place] [facility] MAIN [facility] SERVICES [address] [place], [place], [address] Phone: [phone]   Fax:  [phone]  Name: Jasmine Bowen MRN: [id number] Date of Birth: August 06, 1976

## 2020-06-10 NOTE — Patient Instructions (Addendum)
   Tuck your hips under, then keep the tuck as you lean forward so you feel a stretch through your low back. Hold for 5 deep breaths, repeat 2-3 times, 1-2 times per day.  Mini-Marches    Draw the low tummy in and flatten low back while breathing out with the last part of the exhale drawing the ribs in and together so that pelvis tilts and hold contraction while you lift one foot ~ 2 inches off the mat, then the other foot before relaxing and resetting (start with one leg only). Try to keep your hips from rocking, using your hands to sense whether they are staying even as pictured.      Perform _10__ repetitions for _2__ sets. Do this __ times per day.

## 2020-06-17 ENCOUNTER — Ambulatory Visit: Payer: BC Managed Care – PPO | Attending: Family Medicine

## 2020-06-17 ENCOUNTER — Other Ambulatory Visit: Payer: Self-pay

## 2020-06-17 DIAGNOSIS — M533 Sacrococcygeal disorders, not elsewhere classified: Secondary | ICD-10-CM | POA: Insufficient documentation

## 2020-06-17 DIAGNOSIS — R278 Other lack of coordination: Secondary | ICD-10-CM | POA: Diagnosis present

## 2020-06-17 DIAGNOSIS — M62830 Muscle spasm of back: Secondary | ICD-10-CM | POA: Diagnosis present

## 2020-06-17 NOTE — Therapy (Signed)
[place] [facility] MAIN [facility] SERVICES [address] [place], [place], [address] Phone: [phone]   Fax:  [phone]  Physical Therapy Treatment  The patient has been informed of current processes in place at Outpatient Rehab to protect patients from Covid-19 exposure including social distancing, schedule modifications, and new cleaning procedures. After discussing their particular risk with a therapist based on the patient's personal risk factors, the patient has decided to proceed with in-person therapy.   Patient Details  Name: Jasmine Bowen MRN: [id number] Date of Birth: July 30, 1976 No data recorded  Encounter Date: 06/17/2020   PT End of Session - 06/17/20 1404    Visit Number 6    Number of Visits 10    Date for PT Re-Evaluation 07/22/20    Authorization Type BCBS    Authorization Time Period 05/13/20 through 07/22/20    Authorization - Visit Number 6    Authorization - Number of Visits 10    Progress Note Due on Visit 10    PT Start Time 1345    PT Stop Time 1430    PT Time Calculation (min) 45 min    Activity Tolerance Patient tolerated treatment well;No increased pain    Behavior During Therapy WFL for tasks assessed/performed           Past Medical History:  Diagnosis Date  . Delayed immunizations 09/19/2019  . Medical history non-contributory     Past Surgical History:  Procedure Laterality Date  . WISDOM TOOTH EXTRACTION      There were no vitals filed for this visit.   [place] [facility] MAIN [facility] SERVICES [address] [place], [place], [address] Phone: [phone]   Fax:  [phone]  Physical Therapy Treatment  The patient has been informed of current processes in place at Outpatient Rehab to protect patients from Covid-19 exposure including social distancing, schedule modifications, and new cleaning procedures. After discussing their particular risk with a therapist based on the  patient's personal risk factors, the patient has decided to proceed with in-person therapy.   Patient Details  Name: Jasmine Bowen MRN: [id number] Date of Birth: 1976/03/25 No data recorded  Encounter Date: 06/17/2020   PT End of Session - 06/17/20 1404    Visit Number 6    Number of Visits 10    Date for PT Re-Evaluation 07/22/20    Authorization Type BCBS    Authorization Time Period 05/13/20 through 07/22/20    Authorization - Visit Number 6    Authorization - Number of Visits 10    Progress Note Due on Visit 10    PT Start Time 1345    PT Stop Time 1430    PT Time Calculation (min) 45 min    Activity Tolerance Patient tolerated treatment well;No increased pain    Behavior During Therapy WFL for tasks assessed/performed           Past Medical History:  Diagnosis Date  . Delayed immunizations 09/19/2019  . Medical history non-contributory     Past Surgical History:  Procedure Laterality Date  . WISDOM TOOTH EXTRACTION      There were no vitals filed for this visit.   Pelvic Floor Physical Therapy Treatment Note  SCREENING  Changes in medications, allergies, or medical history?: none    SUBJECTIVE  Patient reports: Has been doing well, she has been sometimes having difficulty with bridges and just does tucks when she does not feel like she can lift correctly.  Precautions:  none  Pain update: Location of pain: R paraspinals Current pain: 0/10  Max pain: 5/10 Least pain: 0/10 Nature of pain:Dull with really strong ache/striking   **0/10 following treatment  Patient Goals: Be able to exercise/dance without pain or SUI.    OBJECTIVE  Changes in: Posture/Observations:  ASIS and PSIS level with heel-lift in place. Hyperlordosis evident.  Range of Motion/Flexibilty:  Decreased mobility Through T-L and L-S junctions   Strength/MMT:  LE MMT:  Pelvic floor:  Abdominal:  Able to control first 1/3 of Bridge but her obliques disengage at that  point and she loses segmental control.  Palpation: TTP to R paraspinals ~ T10-L2 and R piriformis  Gait Analysis:  INTERVENTIONS THIS SESSION: Manual: performed TP release and STM to B Multifidus and paraspinals at ~ L5 followed by grade 3-4 PA mobs to L4-L5 and ~T11-T12 to decrease spasm and pain, increase fascial mobility, and allow for improved posture, ability to recruit core musculature, and balance of musculature for improved function and decreased symptoms.  Dry-needle: Performed TPDN with a .30x65mm needle and standard approach as described below to decrease spasm and pain and allow for improved balance of musculature for improved function and decreased symptoms.  Therex: Performed cat-cow x10 to improve low lumbar mobility followed by Pilates-style bridges to focus on segmental control of the multifidus and allow for the obliques to remain engaged.   Total time: 60 min.                           PT Short Term Goals - 06/10/20 1435      PT SHORT TERM GOAL #1   Title Patient will demonstrate improved pelvic alignment and balance of musculature surrounding the pelvis to facilitate decreased PFM spasms and decrease LB pain.    Baseline RLE long, spasms surrounding R>L hip/LB    Time 5    Period Weeks    Status Achieved    Target Date 06/17/20      PT SHORT TERM GOAL #2   Title Pt. will be compliant with wearing heel-lift during ~90% of the day while being active to help prevent return of spasms and malalignment.    Baseline RLE long by ~ 1.5 cm    Time 5    Period Weeks    Status On-going    Target Date 06/17/20      PT SHORT TERM GOAL #3   Title Patient will demonstrate HEP x1 in the clinic to demonstrate understanding and proper form to allow for further improvement.    Baseline Pt. lacks knowledge of what therapeutic exercises will help her decrease SUI and LBP    Time 5    Period Weeks    Status Achieved    Target Date 06/17/20              PT Long Term Goals - 05/13/20 1451      PT LONG TERM GOAL #1   Title Patient will report no episodes of SUI over the course of the prior two weeks to demonstrate improved functional ability.    Baseline Pt. having SUI with jumping jacks/running etc.    Time 10    Period Weeks    Status New    Target Date 07/22/20      PT LONG TERM GOAL #2   Title Patient will describe pain no greater than 1/10 during moderate to high intensity exercise for 30-45 min. and when getting  out of bed to demonstrate improved functional ability.    Baseline Pt. has increased pain with exercise and when rising from laying flat.    Time 10    Period Weeks    Status New    Target Date 07/22/20      PT LONG TERM GOAL #3   Title Pt. will improve in FOTO score by 10 points (or maximal score) to demonstrate improved function.    Baseline FOTO PFDI urinary: 4 Urinary problem: 60    Time 10    Period Weeks    Status New    Target Date 07/22/20                 Plan - 06/18/20 0919    Clinical Impression Statement Pt. Responded well to all interventions today, demonstrating improved spinal mobility, decreased spasms, increased awareness and muscular control through the core, as well as understanding and correct performance of all education and exercises provided today. They will continue to benefit from skilled physical therapy to work toward remaining goals and maximize function as well as decrease likelihood of symptom increase or recurrence.     PT Next Visit Plan review deep-core and progress as appropriate, assess motions that Pt. is using for her step class and give cues for core engagement.    PT Home Exercise Plan diaphragmatic breathing, side-stretch, hip-flexor stretch, heel-lift for LLE, seated pelvic tilts, pilates-style bridge and cat/cow with emphasis on low  back, hook-lying pelvic tilt/mini-marches and low back stretch.    Consulted and Agree with Plan of Care Patient            Patient will benefit from skilled therapeutic intervention in order to improve the following deficits and impairments:     Visit Diagnosis: Sacrococcygeal disorders, not elsewhere classified  Muscle spasm of back  Other lack of coordination     Problem List Patient Active Problem List   Diagnosis Date Noted  . Delayed immunizations 09/19/2019  . Active labor 03/12/2014   Cleophus Molt DPT, ATC Cleophus Molt 06/18/2020, 9:21 AM  [place] [facility] MAIN [facility] SERVICES [address] [place], [place], [address] Phone: [phone]   Fax:  [phone]  Name: Jasmine Bowen MRN: [id number] Date of Birth: [birth year]

## 2020-06-24 ENCOUNTER — Ambulatory Visit: Payer: BC Managed Care – PPO

## 2020-06-24 ENCOUNTER — Other Ambulatory Visit: Payer: Self-pay

## 2020-06-24 DIAGNOSIS — R278 Other lack of coordination: Secondary | ICD-10-CM

## 2020-06-24 DIAGNOSIS — M533 Sacrococcygeal disorders, not elsewhere classified: Secondary | ICD-10-CM

## 2020-06-24 DIAGNOSIS — M62830 Muscle spasm of back: Secondary | ICD-10-CM

## 2020-06-24 NOTE — Therapy (Signed)
[facility] MAIN [facility] SERVICES [address] [place], [place], [address] Phone: [phone]   Fax:  [phone]  Physical Therapy Treatment  The patient has been informed of current processes in place at Outpatient Rehab to protect patients from Covid-19 exposure including social distancing, schedule modifications, and new cleaning procedures. After discussing their particular risk with a therapist based on the patient's personal risk factors, the patient has decided to proceed with in-person therapy.   Patient Details  Name: Jasmine Bowen MRN: [id number] Date of Birth: Sep 26, 1976 No data recorded  Encounter Date: 06/24/2020   PT End of Session - 06/25/20 0934    Visit Number 7    Number of Visits 10    Date for PT Re-Evaluation 07/22/20    Authorization Type BCBS    Authorization Time Period 05/13/20 through 07/22/20    Authorization - Visit Number 7    Authorization - Number of Visits 10    Progress Note Due on Visit 10    PT Start Time 1330    PT Stop Time 1430    PT Time Calculation (min) 60 min    Activity Tolerance Patient tolerated treatment well;No increased pain    Behavior During Therapy WFL for tasks assessed/performed           Past Medical History:  Diagnosis Date  . Delayed immunizations 09/19/2019  . Medical history non-contributory     Past Surgical History:  Procedure Laterality Date  . WISDOM TOOTH EXTRACTION      There were no vitals filed for this visit.  Pelvic Floor Physical Therapy Treatment Note  SCREENING  Changes in medications, allergies, or medical history?: none    SUBJECTIVE  Patient reports: Still feels ok going down but having a hard time rolling segmentaly up. Has some soreness right in the middle of the low back and feels more of a stretch when she leans to one side or the other with her LB stretch. Pain was after exercising, it is a small area right above the SIJ. Exercising on Tuesdays.  Notices the pain is usually higher after exercising.  Precautions:  none  Pain update: Location of pain: R paraspinals Current pain: 0/10  Max pain: 4/10 Least pain: 0/10 Nature of pain:Dull ache  **0/10 following treatment  Patient Goals: Be able to exercise/dance without pain or SUI.    OBJECTIVE  Changes in: Posture/Observations:  ASIS and PSIS level with heel-lift in place. Hyperlordosis evident.  Range of Motion/Flexibilty:   Strength/MMT:  LE MMT:  Pt. Is compensating for weak glutes by using lumbar extensors and hamstrings.  Pelvic floor:  Abdominal:  Able to control tilt and can control as she comes back down from bridge but she is not using her glutes to bridge, is using hamstrings and LB.  Palpation: TTP to B lumbar paraspinals ~ L4-5 and to  L psoas  Gait Analysis:  INTERVENTIONS THIS SESSION: Manual: performed TP release and STM to to B lumbar paraspinals ~ L4-5 and to  L psoas to decrease spasm and pain, increase fascial mobility, and allow for improved posture, ability to recruit core musculature, and balance of musculature for improved function and decreased symptoms.  Dry-needle: Performed TPDN with a .30x61mm needle and standard approach as described below to decrease spasm and pain and allow for improved balance of musculature for improved function and decreased symptoms.  Therex: Reviewed and practiced pilates bridges to improve performance and assess where restriction is occurring. Pt. Unable to  engage glutes to initiate lifting hips which is leading to difficulty with raising portion of bridge.  Theract: Assessed how Pt. Is performing her step-aerobics motion and cued to hing forward from the waist and intentionally engage the glutes with each step to prevent compensation by hamstrings and LB to decrease spasm and pain.  Total time: 60 min.                      Trigger Point Dry Needling - 06/25/20 0001    Consent  Given? Yes    Education Handout Provided No    Muscles Treated Back/Hip Lumbar multifidi    Dry Needling Comments Bilateral    Lumbar multifidi Response Twitch response elicited;Palpable increased muscle length                  PT Short Term Goals - 06/10/20 1435      PT SHORT TERM GOAL #1   Title Patient will demonstrate improved pelvic alignment and balance of musculature surrounding the pelvis to facilitate decreased PFM spasms and decrease LB pain.    Baseline RLE long, spasms surrounding R>L hip/LB    Time 5    Period Weeks    Status Achieved    Target Date 06/17/20      PT SHORT TERM GOAL #2   Title Pt. will be compliant with wearing heel-lift during ~90% of the day while being active to help prevent return of spasms and malalignment.    Baseline RLE long by ~ 1.5 cm    Time 5    Period Weeks    Status On-going    Target Date 06/17/20      PT SHORT TERM GOAL #3   Title Patient will demonstrate HEP x1 in the clinic to demonstrate understanding and proper form to allow for further improvement.    Baseline Pt. lacks knowledge of what therapeutic exercises will help her decrease SUI and LBP    Time 5    Period Weeks    Status Achieved    Target Date 06/17/20             PT Long Term Goals - 05/13/20 1451      PT LONG TERM GOAL #1   Title Patient will report no episodes of SUI over the course of the prior two weeks to demonstrate improved functional ability.    Baseline Pt. having SUI with jumping jacks/running etc.    Time 10    Period Weeks    Status New    Target Date 07/22/20      PT LONG TERM GOAL #2   Title Patient will describe pain no greater than 1/10 during moderate to high intensity exercise for 30-45 min. and when getting out of bed to demonstrate improved functional ability.    Baseline Pt. has increased pain with exercise and when rising from laying flat.    Time 10    Period Weeks    Status New    Target Date 07/22/20      PT LONG TERM  GOAL #3   Title Pt. will improve in FOTO score by 10 points (or maximal score) to demonstrate improved function.    Baseline FOTO PFDI urinary: 4 Urinary problem: 60    Time 10    Period Weeks    Status New    Target Date 07/22/20                 Plan - 06/25/20 1324  Clinical Impression Statement Pt. Responded well to all interventions today, demonstrating improved understanding of how her body is compensating and awareness of how to intentionally recruit the glutes for activity, decreased spasm and LBP, as well as understanding and correct performance of all education and exercises provided today. They will continue to benefit from skilled physical therapy to work toward remaining goals and maximize function as well as decrease likelihood of symptom increase or recurrence.     PT Next Visit Plan Begin planks and squats incorporating glutes and deep-core. review deep-core and progress as appropriate, assess motions that Pt. is using for her step class and give cues for core engagement.    PT Home Exercise Plan diaphragmatic breathing, side-stretch, hip-flexor stretch, heel-lift for LLE, seated pelvic tilts, pilates-style bridge and cat/cow with emphasis on low  back, hook-lying pelvic tilt/mini-marches and low back stretch, prone heel-press.    Consulted and Agree with Plan of Care Patient           Patient will benefit from skilled therapeutic intervention in order to improve the following deficits and impairments:     Visit Diagnosis: Sacrococcygeal disorders, not elsewhere classified  Muscle spasm of back  Other lack of coordination     Problem List Patient Active Problem List   Diagnosis Date Noted  . Delayed immunizations 09/19/2019  . Active labor 03/12/2014   Cleophus Molt DPT, ATC Cleophus Molt 06/25/2020, 9:44 AM  [facility] MAIN [facility] SERVICES [address] [place], [place], [address] Phone: [phone]   Fax:   [phone]  Name: Jasmine Bowen MRN: [id number] Date of Birth: Sep 01, 1976

## 2020-06-24 NOTE — Patient Instructions (Signed)
  Exhale starting just before and while you press the heel toward the ceiling and hold for 1 sec. Release and repeat 2x15 on each side.

## 2020-07-01 ENCOUNTER — Ambulatory Visit: Payer: BC Managed Care – PPO

## 2020-07-01 ENCOUNTER — Other Ambulatory Visit: Payer: Self-pay

## 2020-07-01 DIAGNOSIS — M533 Sacrococcygeal disorders, not elsewhere classified: Secondary | ICD-10-CM | POA: Diagnosis not present

## 2020-07-01 DIAGNOSIS — M62830 Muscle spasm of back: Secondary | ICD-10-CM

## 2020-07-01 DIAGNOSIS — R278 Other lack of coordination: Secondary | ICD-10-CM

## 2020-07-01 NOTE — Therapy (Signed)
[place] [facility] MAIN [facility] SERVICES [address] [place], [place], [address] Phone: [phone]   Fax:  [phone]  Physical Therapy Treatment  The patient has been informed of current processes in place at Outpatient Rehab to protect patients from Covid-19 exposure including social distancing, schedule modifications, and new cleaning procedures. After discussing their particular risk with a therapist based on the patient's personal risk factors, the patient has decided to proceed with in-person therapy.  Patient Details  Name: Jasmine Bowen MRN: [id number] Date of Birth: 09/20/1976 No data recorded  Encounter Date: 07/01/2020   PT End of Session - 07/01/20 1422    Visit Number 8    Number of Visits 10    Date for PT Re-Evaluation 07/22/20    Authorization Type BCBS    Authorization Time Period 05/13/20 through 07/22/20    Authorization - Visit Number 8    Authorization - Number of Visits 10    Progress Note Due on Visit 10    PT Start Time 1330    PT Stop Time 1430    PT Time Calculation (min) 60 min    Activity Tolerance Patient tolerated treatment well;No increased pain    Behavior During Therapy WFL for tasks assessed/performed           Past Medical History:  Diagnosis Date  . Delayed immunizations 09/19/2019  . Medical history non-contributory     Past Surgical History:  Procedure Laterality Date  . WISDOM TOOTH EXTRACTION      There were no vitals filed for this visit.  Pelvic Floor Physical Therapy Treatment Note  SCREENING  Changes in medications, allergies, or medical history?: none    SUBJECTIVE  Patient reports: Is feeling good. She recorded on her phone how she has felt all week. Today and yesterday she has not felt anything the one side hurt up until Tuesday but has actually been better since then. She noted that when she put the tennis ball in the back she felt pain radiating into the hip and down the leg. Tried to  release the R hip-flexors   Precautions:  none  Pain update: Location of pain: R paraspinals Current pain: 0/10  Max pain: 2/10 Least pain: 0/10 Nature of pain:Dull ache  **0/10 following treatment  Patient Goals: Be able to exercise/dance without pain or SUI.    OBJECTIVE  Changes in: Posture/Observations:   Hyperlordosis remains evident.  Range of Motion/Flexibilty:   Strength/MMT:  LE MMT:  Pt. Has greater difficulty recruiting L>R glutes and wants to compensate with LB extensors.  Pelvic floor:  Abdominal:  Able to demonstrate appropriate form with bridges with min. Cueing! Endurance in knee plank ~ 20 sec.  Palpation:  Gait Analysis:  INTERVENTIONS THIS SESSION:  Therex: Reviewed and practiced hip-flexor stretches and pilates bridges for performance improvement and Educated on and practiced tall kneeling hip-hinges, squats, and planks with emphasis on gradual recruitment and release of the glutes and deep-core endurance without compensation during planks to improve balance of musculature surrounding the pelvis and allow for decreased spasm and pain. Used mirror to improve proprioception for how to find pelvic neutral.  Total time: 60 min.                               PT Short Term Goals - 06/10/20 1435      PT SHORT TERM GOAL #1   Title Patient will demonstrate improved  pelvic alignment and balance of musculature surrounding the pelvis to facilitate decreased PFM spasms and decrease LB pain.    Baseline RLE long, spasms surrounding R>L hip/LB    Time 5    Period Weeks    Status Achieved    Target Date 06/17/20      PT SHORT TERM GOAL #2   Title Pt. will be compliant with wearing heel-lift during ~90% of the day while being active to help prevent return of spasms and malalignment.    Baseline RLE long by ~ 1.5 cm    Time 5    Period Weeks    Status On-going    Target Date 06/17/20      PT SHORT TERM GOAL #3   Title  Patient will demonstrate HEP x1 in the clinic to demonstrate understanding and proper form to allow for further improvement.    Baseline Pt. lacks knowledge of what therapeutic exercises will help her decrease SUI and LBP    Time 5    Period Weeks    Status Achieved    Target Date 06/17/20             PT Long Term Goals - 05/13/20 1451      PT LONG TERM GOAL #1   Title Patient will report no episodes of SUI over the course of the prior two weeks to demonstrate improved functional ability.    Baseline Pt. having SUI with jumping jacks/running etc.    Time 10    Period Weeks    Status New    Target Date 07/22/20      PT LONG TERM GOAL #2   Title Patient will describe pain no greater than 1/10 during moderate to high intensity exercise for 30-45 min. and when getting out of bed to demonstrate improved functional ability.    Baseline Pt. has increased pain with exercise and when rising from laying flat.    Time 10    Period Weeks    Status New    Target Date 07/22/20      PT LONG TERM GOAL #3   Title Pt. will improve in FOTO score by 10 points (or maximal score) to demonstrate improved function.    Baseline FOTO PFDI urinary: 4 Urinary problem: 60    Time 10    Period Weeks    Status New    Target Date 07/22/20                 Plan - 07/01/20 1422    Clinical Impression Statement Pt. Responded well to all interventions today, demonstrating improved ability to recruit and effectively incorporate glutes and TA in more functional exercises as well as understanding and correct performance of all education and exercises provided today. They will continue to benefit from skilled physical therapy to work toward remaining goals and maximize function as well as decrease likelihood of symptom increase or recurrence.    PT Next Visit Plan (put planks in handout, Review planks andhip-hinges, progress to squats as appropriate. Increase deep-core difficulty. Educate on multi-stepping  to progress toward ballistic motions.    PT Home Exercise Plan diaphragmatic breathing, side-stretch, hip-flexor stretch, heel-lift for LLE, seated pelvic tilts, pilates-style bridge and cat/cow with emphasis on low  back, hook-lying pelvic tilt/mini-marches and low back stretch, prone heel-press, hip-hinges, planks (not given in handout).    Consulted and Agree with Plan of Care Patient           Patient will benefit from skilled  therapeutic intervention in order to improve the following deficits and impairments:     Visit Diagnosis: Sacrococcygeal disorders, not elsewhere classified  Muscle spasm of back  Other lack of coordination     Problem List Patient Active Problem List   Diagnosis Date Noted  . Delayed immunizations 09/19/2019  . Active labor 03/12/2014   Cleophus Molt DPT, ATC Cleophus Molt 07/01/2020, 2:32 PM  [place] [facility] MAIN [facility] SERVICES [address] [place], [place], [address] Phone: [phone]   Fax:  [phone]  Name: Jasmine Bowen MRN: [id number] Date of Birth: 1976-02-22

## 2020-07-01 NOTE — Patient Instructions (Signed)
"  squeeze at the top, gradually un-squeeze as you come down"  Keep the torso straight as you inhale hinging at the hip and bringing your bottom back and then exhale, pulling the pelvic floor up, drawing in the lower abdomen and squeezing through the glutes to come back to tall kneeling.  Do this _2x15__ times, __1_ times per day.   Flexors, Lunge  Hip Flexor Stretch: Proposal Pose    Maintain pelvic tuck under, lift pubic bone toward navel. Engage posterior hip muscles (firm glute muscles of leg in back position) and shift forward until you feel stretch on front of leg that is down. To increase stretch, maintain balance and ease hips forward. You may use one hand on a chair for balance if needed. Hold for __5__ breaths. Repeat __2-3__ times each leg.  Do _1-2__ times per day.

## 2020-07-08 ENCOUNTER — Other Ambulatory Visit: Payer: Self-pay

## 2020-07-08 ENCOUNTER — Ambulatory Visit: Payer: BC Managed Care – PPO

## 2020-07-08 DIAGNOSIS — M533 Sacrococcygeal disorders, not elsewhere classified: Secondary | ICD-10-CM

## 2020-07-08 DIAGNOSIS — R278 Other lack of coordination: Secondary | ICD-10-CM

## 2020-07-08 DIAGNOSIS — M62830 Muscle spasm of back: Secondary | ICD-10-CM

## 2020-07-08 NOTE — Therapy (Signed)
[place] [facility] [facility] MAIN [facility] SERVICES [address] [place], [place], [address] Phone: [phone]   Fax:  [phone]  Physical Therapy Treatment  The patient has been informed of current processes in place at Outpatient Rehab to protect patients from Covid-19 exposure including social distancing, schedule modifications, and new cleaning procedures. After discussing their particular risk with a therapist based on the patient's personal risk factors, the patient has decided to proceed with in-person therapy.  Patient Details  Name: Jasmine Bowen MRN: [id number] Date of Birth: 03/09/1976 No data recorded  Encounter Date: 07/08/2020   PT End of Session - 07/09/20 1341    Visit Number 9    Number of Visits 10    Date for PT Re-Evaluation 07/22/20    Authorization Type BCBS    Authorization Time Period 05/13/20 through 07/22/20    Authorization - Visit Number 9    Authorization - Number of Visits 10    Progress Note Due on Visit 10    PT Start Time 1330    PT Stop Time 1430    PT Time Calculation (min) 60 min    Activity Tolerance Patient tolerated treatment well;No increased pain    Behavior During Therapy WFL for tasks assessed/performed           Past Medical History:  Diagnosis Date  . Delayed immunizations 09/19/2019  . Medical history non-contributory     Past Surgical History:  Procedure Laterality Date  . WISDOM TOOTH EXTRACTION      There were no vitals filed for this visit.  Pelvic Floor Physical Therapy Treatment Note  SCREENING  Changes in medications, allergies, or medical history?: none    SUBJECTIVE  Patient reports: Is feeling good. She has done her step class without increased pain. Still has a little stiffness first thing in the morning but that is all. Went to [place] dance class this last Saturday and it went well.   Precautions:  none  Pain update: Location of pain: R paraspinals Current pain: 0/10  Max  pain: 1/10 Least pain: 0/10 Nature of pain:Dull ache  **0/10 following treatment  Patient Goals: Be able to exercise/dance without pain or SUI.    OBJECTIVE  Changes in: Posture/Observations:   Hyperlordosis remains evident.  Range of Motion/Flexibilty:   Strength/MMT:  LE MMT:   Pelvic floor:  Abdominal:  Endurance in knee plank ~ 30 sec. And Pt. Able to perform lowest modifications of boat pose and toe-taps.  Palpation:  Gait Analysis:  INTERVENTIONS THIS SESSION:  Therex: Reviewed kneeling hip-hinges and progressed to squats. Educated on and practiced multi-direstion stepping with balance and standing quad stretch to allow sufficient quad ROM to let hip-flexors release. Educated on and practiced how to down-modify boat-pose and leg-lift type exercises to appropriate intensity and educated on how to release quads with foam roller  Total time: 60 min.                               PT Short Term Goals - 06/10/20 1435      PT SHORT TERM GOAL #1   Title Patient will demonstrate improved pelvic alignment and balance of musculature surrounding the pelvis to facilitate decreased PFM spasms and decrease LB pain.    Baseline RLE long, spasms surrounding R>L hip/LB    Time 5    Period Weeks    Status Achieved    Target Date 06/17/20  PT SHORT TERM GOAL #2   Title Pt. will be compliant with wearing heel-lift during ~90% of the day while being active to help prevent return of spasms and malalignment.    Baseline RLE long by ~ 1.5 cm    Time 5    Period Weeks    Status On-going    Target Date 06/17/20      PT SHORT TERM GOAL #3   Title Patient will demonstrate HEP x1 in the clinic to demonstrate understanding and proper form to allow for further improvement.    Baseline Pt. lacks knowledge of what therapeutic exercises will help her decrease SUI and LBP    Time 5    Period Weeks    Status Achieved    Target Date 06/17/20              PT Long Term Goals - 05/13/20 1451      PT LONG TERM GOAL #1   Title Patient will report no episodes of SUI over the course of the prior two weeks to demonstrate improved functional ability.    Baseline Pt. having SUI with jumping jacks/running etc.    Time 10    Period Weeks    Status New    Target Date 07/22/20      PT LONG TERM GOAL #2   Title Patient will describe pain no greater than 1/10 during moderate to high intensity exercise for 30-45 min. and when getting out of bed to demonstrate improved functional ability.    Baseline Pt. has increased pain with exercise and when rising from laying flat.    Time 10    Period Weeks    Status New    Target Date 07/22/20      PT LONG TERM GOAL #3   Title Pt. will improve in FOTO score by 10 points (or maximal score) to demonstrate improved function.    Baseline FOTO PFDI urinary: 4 Urinary problem: 60    Time 10    Period Weeks    Status New    Target Date 07/22/20                 Plan - 07/09/20 1342    Clinical Impression Statement Pt. Responded well to all interventions today, demonstrating improved deep-core recruitment and control in variable positions and ability to integrate glutes into standing squat as well as understanding and correct performance of all education and exercises provided today. They will continue to benefit from skilled physical therapy to work toward remaining goals and maximize function as well as decrease likelihood of symptom increase or recurrence.    PT Next Visit Plan (put planks in handout, Review planks andhip-hinges, progress to squats as appropriate. Increase deep-core difficulty. Educate on multi-stepping to progress toward ballistic motions.    PT Home Exercise Plan diaphragmatic breathing, side-stretch, hip-flexor stretch, heel-lift for LLE, seated pelvic tilts, pilates-style bridge and cat/cow with emphasis on low  back, hook-lying pelvic tilt/mini-marches and low back stretch, prone  heel-press, hip-hinges, planks (not given in handout). multidirection stepping, quad stretch, squats.    Consulted and Agree with Plan of Care Patient           Patient will benefit from skilled therapeutic intervention in order to improve the following deficits and impairments:     Visit Diagnosis: Sacrococcygeal disorders, not elsewhere classified  Muscle spasm of back  Other lack of coordination     Problem List Patient Active Problem List   Diagnosis Date Noted  .  Delayed immunizations 09/19/2019  . Active labor 03/12/2014   Cleophus Molt DPT, ATC Cleophus Molt 07/09/2020, 1:46 PM  [place] [facility] MAIN [facility] SERVICES [address] [place], [place], [address] Phone: [phone]   Fax:  [phone]  Name: MYYA MEENACH MRN: [id number] Date of Birth: 1976/05/05

## 2020-07-08 NOTE — Patient Instructions (Addendum)
         Stand with equal weight on both feet engage the pelvic floor and lower abdomen and then lunge with the same leg 5 times in each direction, keeping foot forward and balancing for a second before placing the foot down between repetitions.  _2-3__ reps _1-2__ times per day.   Keep your trunk as one unit and let it hinge forward from the hips as you push your bottom back and bend your knees at the same rate that you bend your hips. Keep your weight back toward your heels but do not actually lift the toes off the ground. Exhale starting just before and all the way through standing to help engage the glutes and lower tummy muscles.   Hold for 5 deep breaths, do 2-3 times on each side.

## 2020-07-15 ENCOUNTER — Ambulatory Visit: Payer: BC Managed Care – PPO

## 2020-07-15 ENCOUNTER — Other Ambulatory Visit: Payer: Self-pay

## 2020-07-15 DIAGNOSIS — M62830 Muscle spasm of back: Secondary | ICD-10-CM

## 2020-07-15 DIAGNOSIS — R278 Other lack of coordination: Secondary | ICD-10-CM

## 2020-07-15 DIAGNOSIS — M533 Sacrococcygeal disorders, not elsewhere classified: Secondary | ICD-10-CM

## 2020-07-15 NOTE — Patient Instructions (Signed)
Mini-Marches   "Keep the tailbone on the mat and control on the way down."  Exhale, drawing the lower tummy (TA) in toward the back bone and hold contraction while you lift one foot ~ 2 inches off the mat, then the other foot before relaxing and resetting. Try to keep your hips from rocking, using your hands to sense whether they are staying even as pictured.      Perform _10__ repetitions for _3__ sets. Do this _1_ times per day.   For stepping exercise, exhale strong to push off and bring the knee up to ~ 90 degrees as you hold.

## 2020-07-15 NOTE — Therapy (Signed)
[facility] MAIN [facility] SERVICES [address] [place], [place], [address] Phone: [phone]   Fax:  [phone]  Physical Therapy Treatment  The patient has been informed of current processes in place at [facility] to protect patients from Covid-19 exposure including social distancing, schedule modifications, and new cleaning procedures. After discussing their particular risk with a therapist based on the patient's personal risk factors, the patient has decided to proceed with in-person therapy.  Patient Details  Name: Jasmine Bowen MRN: [id number] Date of Birth: 1976/03/25 No data recorded  Encounter Date: 07/15/2020   PT End of Session - 07/16/20 0945    Visit Number 10    Number of Visits 10    Date for PT Re-Evaluation 07/22/20    Authorization Type [employer]    Authorization Time Period 05/13/20 through 07/22/20    Authorization - Visit Number 10    Authorization - Number of Visits 10    Progress Note Due on Visit 10    PT Start Time 1330    PT Stop Time 1430    PT Time Calculation (min) 60 min    Activity Tolerance Patient tolerated treatment well;No increased pain    Behavior During Therapy WFL for tasks assessed/performed           Past Medical History:  Diagnosis Date  . Delayed immunizations 09/19/2019  . Medical history non-contributory     Past Surgical History:  Procedure Laterality Date  . WISDOM TOOTH EXTRACTION      There were no vitals filed for this visit.  Pelvic Floor Physical Therapy Treatment Note  SCREENING  Changes in medications, allergies, or medical history?: none    SUBJECTIVE  Patient reports: Has had pain in the R hip and LB still, can tell her hip-flexors are spasming and she keeps having to release them with the tennis ball/  Precautions:  none  Pain update: Location of pain: R LB and R hip Current pain: 0/10  Max pain: 3/10 Least pain: 0/10 Nature of pain:Dull ache  **0/10  following treatment  Patient Goals: Be able to exercise/dance without pain or SUI.    OBJECTIVE  Changes in: Posture/Observations:   Hyperlordosis remains evident.  Range of Motion/Flexibilty:   Strength/MMT:  LE MMT:   Pelvic floor:  Abdominal:  Endurance in knee plank ~ 45 sec to 1 min. But still having a hard time maintaining TA engagement as she brings the legs up/down with mini-march  Palpation: TTP to R lumbar multifidus and paraspinals at ~ L3-5 and Psoas distally as well as Pectineus and TFL.  Gait Analysis:  INTERVENTIONS THIS SESSION:  Therex: Reviewed and practiced multi-direction stepping with balance and mini-marches to improve strength of muscles opposing tight musculature to allow reciprocal inhibition to improve balance of musculature surrounding the pelvis and improve overall posture for optimal musculature length-tension relationship and function.  Manual: Performed TP release to R lumbar multifidus and paraspinals at ~ L3-5 and Psoas distally as well as Pectineus and TFL to decrease spasm and pain and allow for improved balance of musculature for improved function and decreased symptoms.  Dry-needle: Performed TPDN with a .30x87mm needle and standard approach as described below to decrease spasm and pain and allow for improved balance of musculature for improved function and decreased symptoms.   Total time: 60 min.  PT Short Term Goals - 06/10/20 1435      PT SHORT TERM GOAL #1   Title Patient will demonstrate improved pelvic alignment and balance of musculature surrounding the pelvis to facilitate decreased PFM spasms and decrease LB pain.    Baseline RLE long, spasms surrounding R>L hip/LB    Time 5    Period Weeks    Status Achieved    Target Date 06/17/20      PT SHORT TERM GOAL #2   Title Pt. will be compliant with wearing heel-lift during ~90% of the day while being active to help  prevent return of spasms and malalignment.    Baseline RLE long by ~ 1.5 cm    Time 5    Period Weeks    Status On-going    Target Date 06/17/20      PT SHORT TERM GOAL #3   Title Patient will demonstrate HEP x1 in the clinic to demonstrate understanding and proper form to allow for further improvement.    Baseline Pt. lacks knowledge of what therapeutic exercises will help her decrease SUI and LBP    Time 5    Period Weeks    Status Achieved    Target Date 06/17/20             PT Long Term Goals - 05/13/20 1451      PT LONG TERM GOAL #1   Title Patient will report no episodes of SUI over the course of the prior two weeks to demonstrate improved functional ability.    Baseline Pt. having SUI with jumping jacks/running etc.    Time 10    Period Weeks    Status New    Target Date 07/22/20      PT LONG TERM GOAL #2   Title Patient will describe pain no greater than 1/10 during moderate to high intensity exercise for 30-45 min. and when getting out of bed to demonstrate improved functional ability.    Baseline Pt. has increased pain with exercise and when rising from laying flat.    Time 10    Period Weeks    Status New    Target Date 07/22/20      PT LONG TERM GOAL #3   Title Pt. will improve in FOTO score by 10 points (or maximal score) to demonstrate improved function.    Baseline FOTO PFDI urinary: 4 Urinary problem: 60    Time 10    Period Weeks    Status New    Target Date 07/22/20                 Plan - 07/16/20 0946    Clinical Impression Statement Pt. Responded well to all interventions today, demonstrating decreased spasm and pain and improved deep-core recruitment as well as understanding and correct performance of all education and exercises provided today. They will continue to benefit from skilled physical therapy to work toward remaining goals and maximize function as well as decrease likelihood of symptom increase or recurrence.   PT Next Visit  Plan Increase deep-core difficulty. Educate on multi-stepping to progress toward ballistic motions  add foam pad and diagonals. Teach landing .    PT Home Exercise Plan diaphragmatic breathing, side-stretch, hip-flexor stretch, heel-lift for LLE, seated pelvic tilts, pilates-style bridge and cat/cow with emphasis on low  back, hook-lying pelvic tilt/mini-marches and low back stretch, prone heel-press, hip-hinges, planks (not given in handout). multidirection stepping, quad stretch, squats.    Consulted and Agree with  Plan of Care Patient           Patient will benefit from skilled therapeutic intervention in order to improve the following deficits and impairments:     Visit Diagnosis: Sacrococcygeal disorders, not elsewhere classified  Muscle spasm of back  Other lack of coordination     Problem List Patient Active Problem List   Diagnosis Date Noted  . Delayed immunizations 09/19/2019  . Active labor 03/12/2014   Cleophus Molt DPT, ATC Cleophus Molt 07/16/2020, 9:48 AM  [facility] MAIN [facility] SERVICES [address] [place], [place], [address] Phone: [phone]   Fax:  [phone]  Name: LAWAN NANEZ MRN: [id number] Date of Birth: Dec 04, 1976

## 2020-07-22 ENCOUNTER — Ambulatory Visit: Payer: BC Managed Care – PPO

## 2020-08-19 ENCOUNTER — Other Ambulatory Visit: Payer: Self-pay | Admitting: Obstetrics and Gynecology

## 2020-08-19 DIAGNOSIS — R928 Other abnormal and inconclusive findings on diagnostic imaging of breast: Secondary | ICD-10-CM

## 2020-08-25 ENCOUNTER — Ambulatory Visit
Admission: RE | Admit: 2020-08-25 | Discharge: 2020-08-25 | Disposition: A | Discharge status: Home / Self Care | Payer: BC Managed Care – PPO | Source: Ambulatory Visit | Attending: Obstetrics and Gynecology | Admitting: Obstetrics and Gynecology

## 2020-08-25 ENCOUNTER — Other Ambulatory Visit: Payer: Self-pay

## 2020-08-25 ENCOUNTER — Other Ambulatory Visit: Payer: Self-pay | Admitting: Obstetrics and Gynecology

## 2020-08-25 DIAGNOSIS — R928 Other abnormal and inconclusive findings on diagnostic imaging of breast: Secondary | ICD-10-CM

## 2020-09-13 ENCOUNTER — Other Ambulatory Visit: Payer: BC Managed Care – PPO

## 2020-09-13 DIAGNOSIS — Z20822 Contact with and (suspected) exposure to covid-19: Secondary | ICD-10-CM

## 2020-09-14 LAB — NOVEL CORONAVIRUS, NAA: SARS-CoV-2, NAA: NOT DETECTED

## 2020-09-14 LAB — SARS-COV-2, NAA 2 DAY TAT

## 2021-03-22 ENCOUNTER — Other Ambulatory Visit: Payer: Self-pay

## 2021-03-22 ENCOUNTER — Other Ambulatory Visit: Payer: Self-pay | Admitting: Obstetrics and Gynecology

## 2021-03-22 ENCOUNTER — Ambulatory Visit
Admission: RE | Admit: 2021-03-22 | Discharge: 2021-03-22 | Disposition: A | Discharge status: Home / Self Care | Payer: BC Managed Care – PPO | Source: Ambulatory Visit | Attending: Obstetrics and Gynecology | Admitting: Obstetrics and Gynecology

## 2021-03-22 DIAGNOSIS — R928 Other abnormal and inconclusive findings on diagnostic imaging of breast: Secondary | ICD-10-CM

## 2021-09-22 ENCOUNTER — Ambulatory Visit
Admission: RE | Admit: 2021-09-22 | Discharge: 2021-09-22 | Disposition: A | Discharge status: Home / Self Care | Payer: BC Managed Care – PPO | Source: Ambulatory Visit | Attending: Obstetrics and Gynecology | Admitting: Obstetrics and Gynecology

## 2021-09-22 ENCOUNTER — Other Ambulatory Visit: Payer: Self-pay

## 2021-09-22 DIAGNOSIS — R928 Other abnormal and inconclusive findings on diagnostic imaging of breast: Secondary | ICD-10-CM

## 2022-03-07 IMAGING — MG MM DIGITAL DIAGNOSTIC UNILAT*L* W/ TOMO W/ CAD
4 series · 4 of 12 positions shown · non-contrast
Comparison: Previous exam(s).

CLINICAL DATA: Patient recalled from screening for left breast
asymmetry.

EXAM:
DIGITAL DIAGNOSTIC LEFT MAMMOGRAM WITH CAD AND TOMO
ULTRASOUND LEFT BREAST

[L MLO synth-2D]
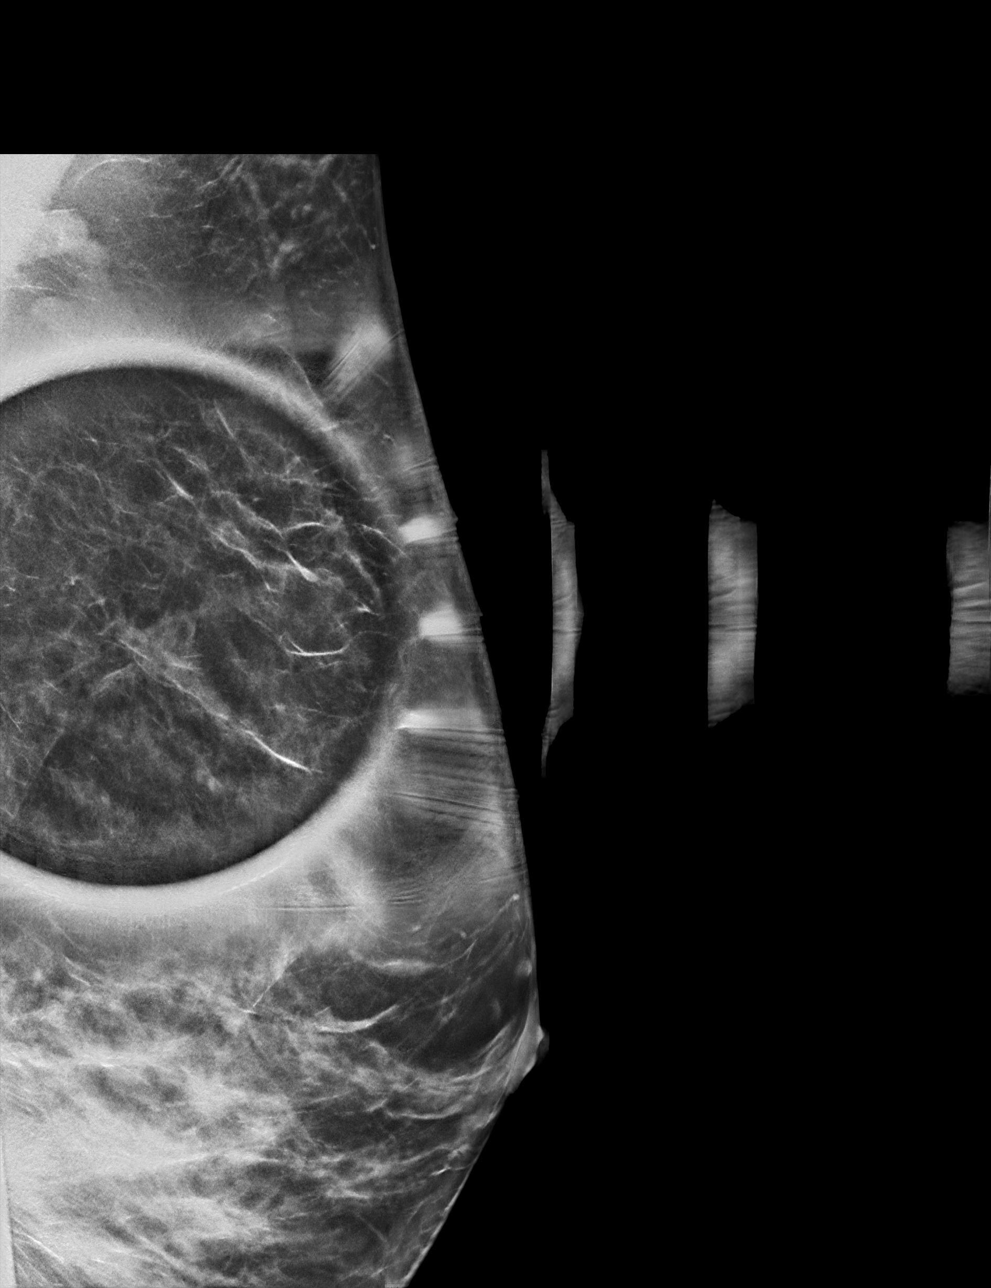

[L ML synth-2D]
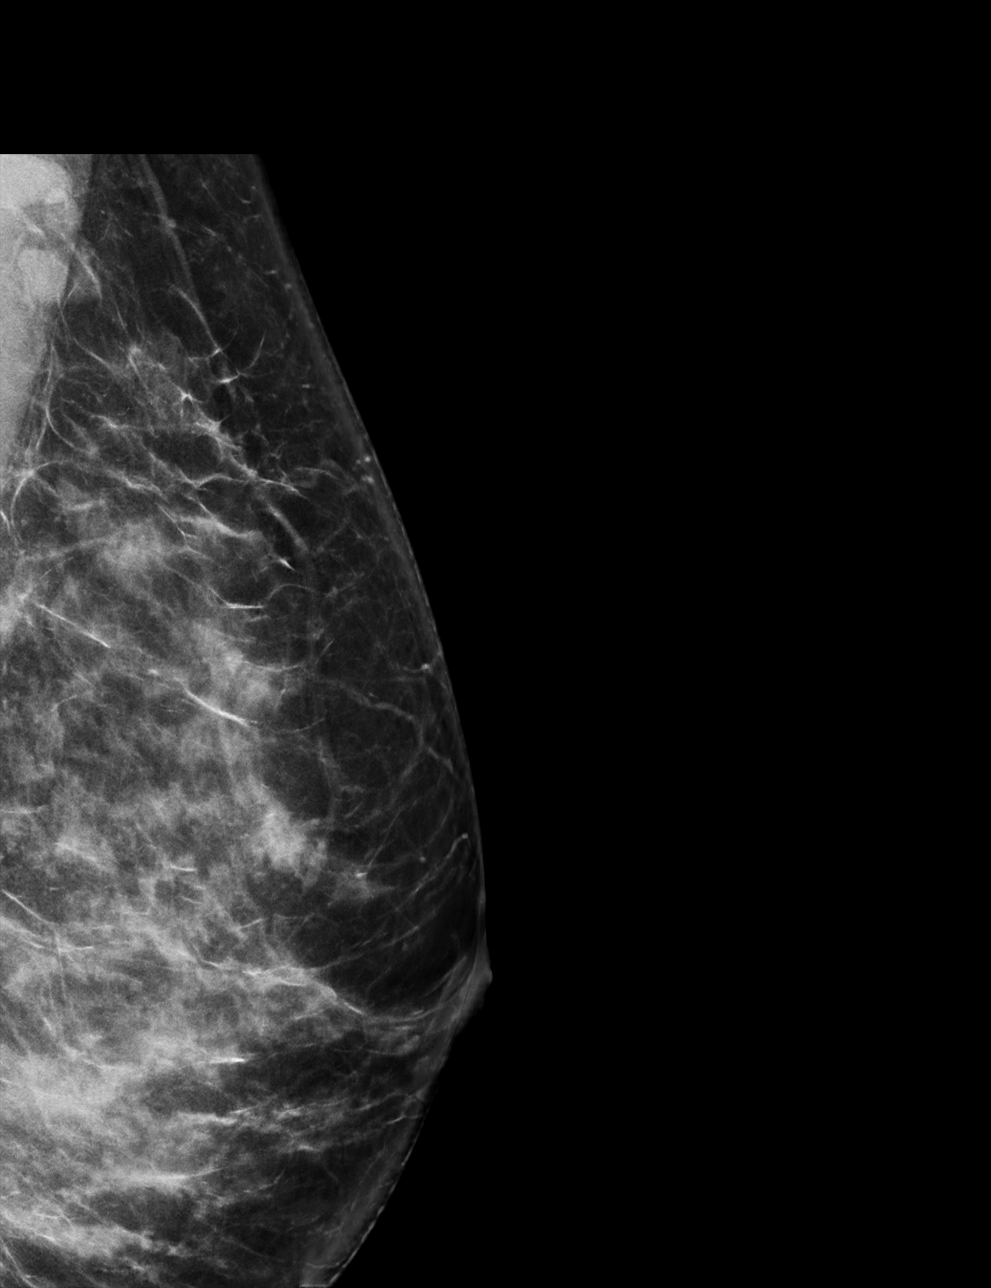

[L MLO tomo · tomo slice 38/75.0]
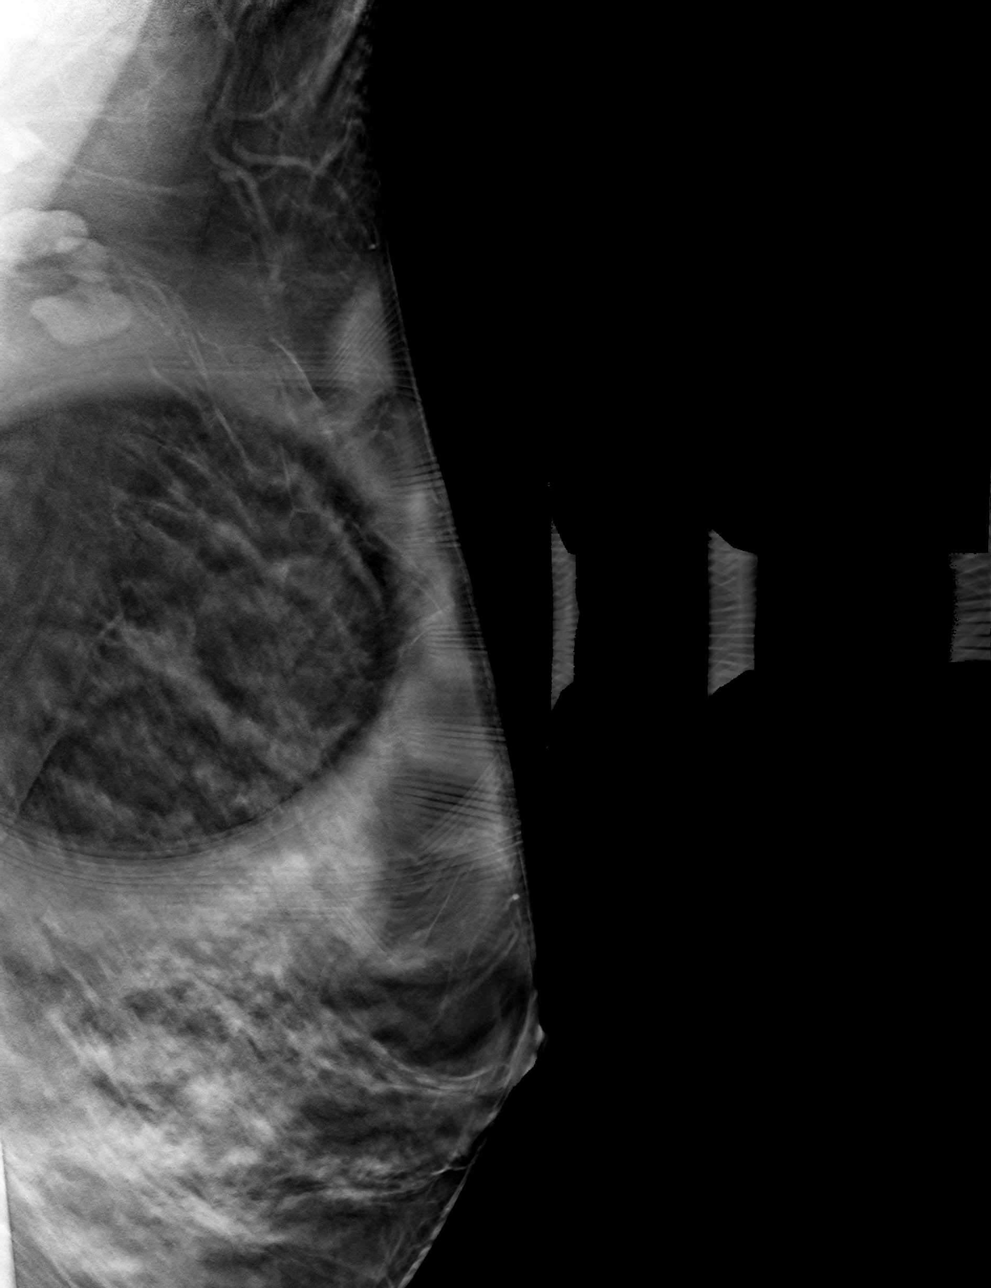

[L ML tomo · tomo slice 41/82.0]
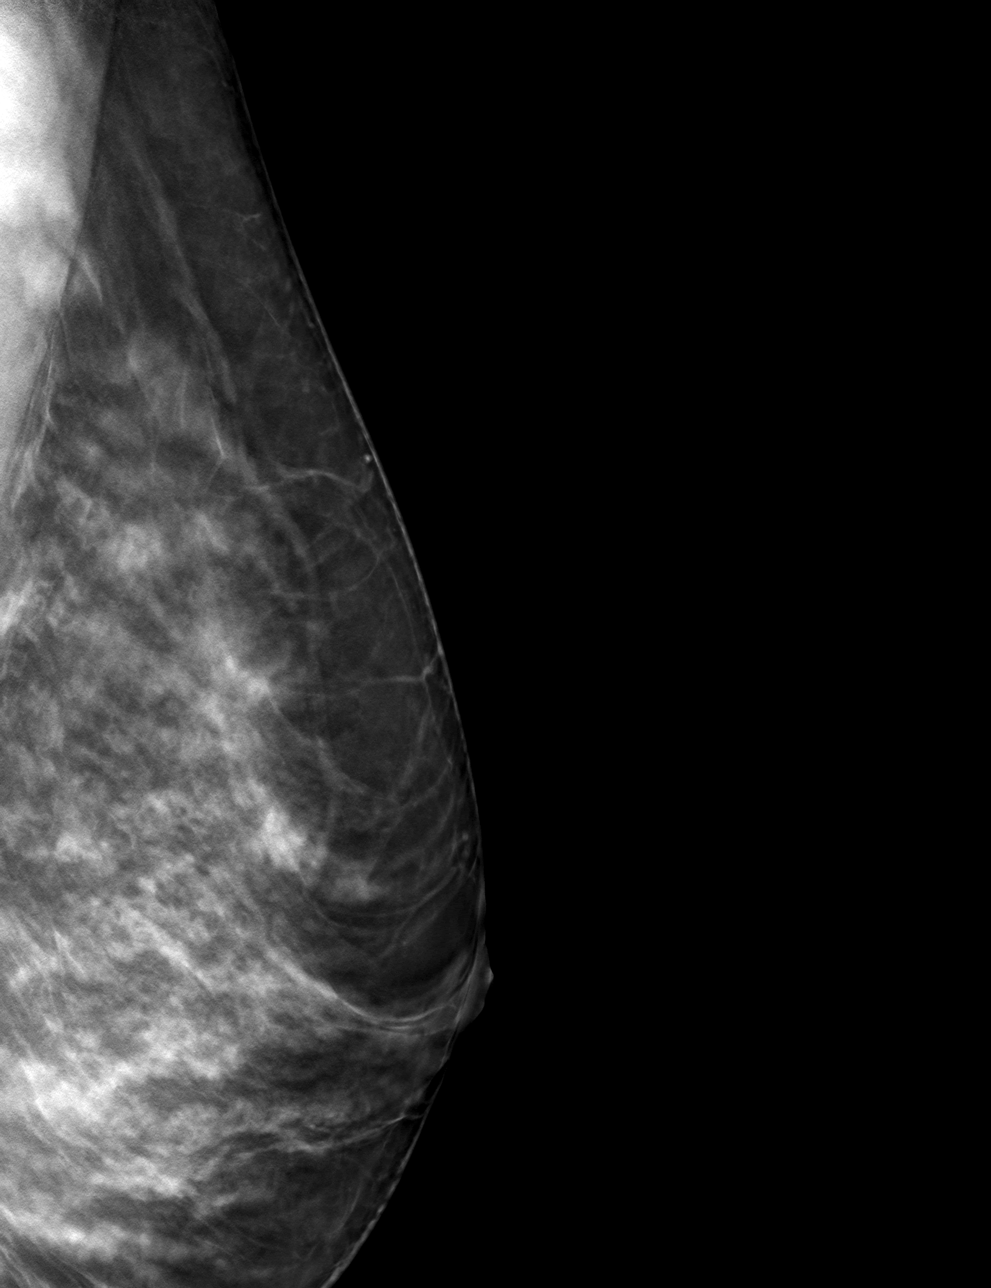

[4 of 12 positions shown; findings below may reference images not displayed]

ACR Breast Density Category c: The breast tissue is heterogeneously
dense, which may obscure small masses.
FINDINGS: Questioned asymmetry within the superior left breast posteriorly on
the MLO view partially effaced with additional suggestive of dense
fibroglandular tissue.

Mammographic images were processed with CAD.

Targeted ultrasound is performed, showing dense tissue within the
left breast 1 o'clock position 7 cm from nipple felt to correspond
mammographic asymmetry.
IMPRESSION: Probably benign left breast asymmetry, favored to represent dense
fibroglandular tissue.

RECOMMENDATION:
Left breast diagnostic mammogram and possible ultrasound in 6 months
to reassess probably benign left breast asymmetry.

I have discussed the findings and recommendations with the patient.
If applicable, a reminder letter will be sent to the patient
regarding the next appointment.

BI-RADS CATEGORY  3: Probably benign.

## 2022-09-18 ENCOUNTER — Other Ambulatory Visit: Payer: Self-pay | Admitting: Obstetrics and Gynecology

## 2022-09-18 DIAGNOSIS — N6489 Other specified disorders of breast: Secondary | ICD-10-CM

## 2022-10-02 IMAGING — MG MM DIGITAL DIAGNOSTIC UNILAT*L* W/ TOMO W/ CAD
6 of 10 series · 6 of 30 positions shown · non-contrast
Comparison: Previous exam(s).

CLINICAL DATA: Short-term follow-up for a likely benign left breast
asymmetry.

EXAM:
DIGITAL DIAGNOSTIC UNILATERAL LEFT MAMMOGRAM WITH TOMOSYNTHESIS AND
CAD
TECHNIQUE: Left digital diagnostic mammography and breast tomosynthesis was
performed. The images were evaluated with computer-aided detection.

[L MLO synth-2D (1 of 3)]
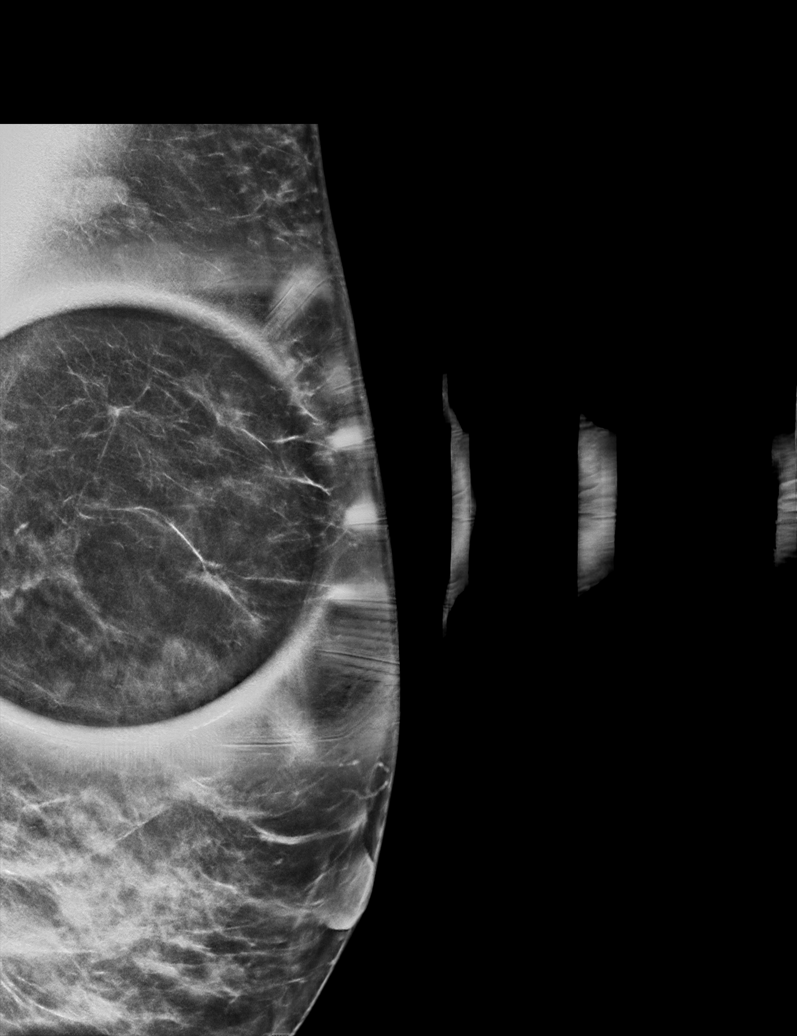

[L CC synth-2D (1 of 2)]
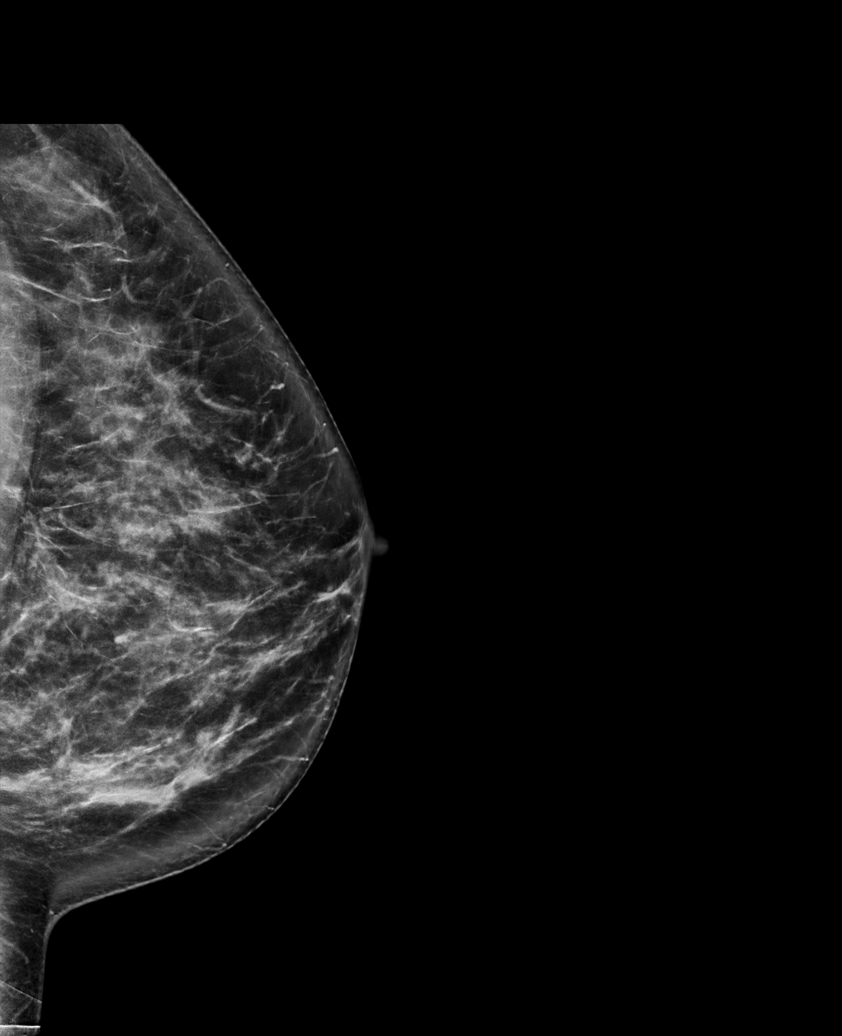

[L MLO synth-2D (2 of 3)]
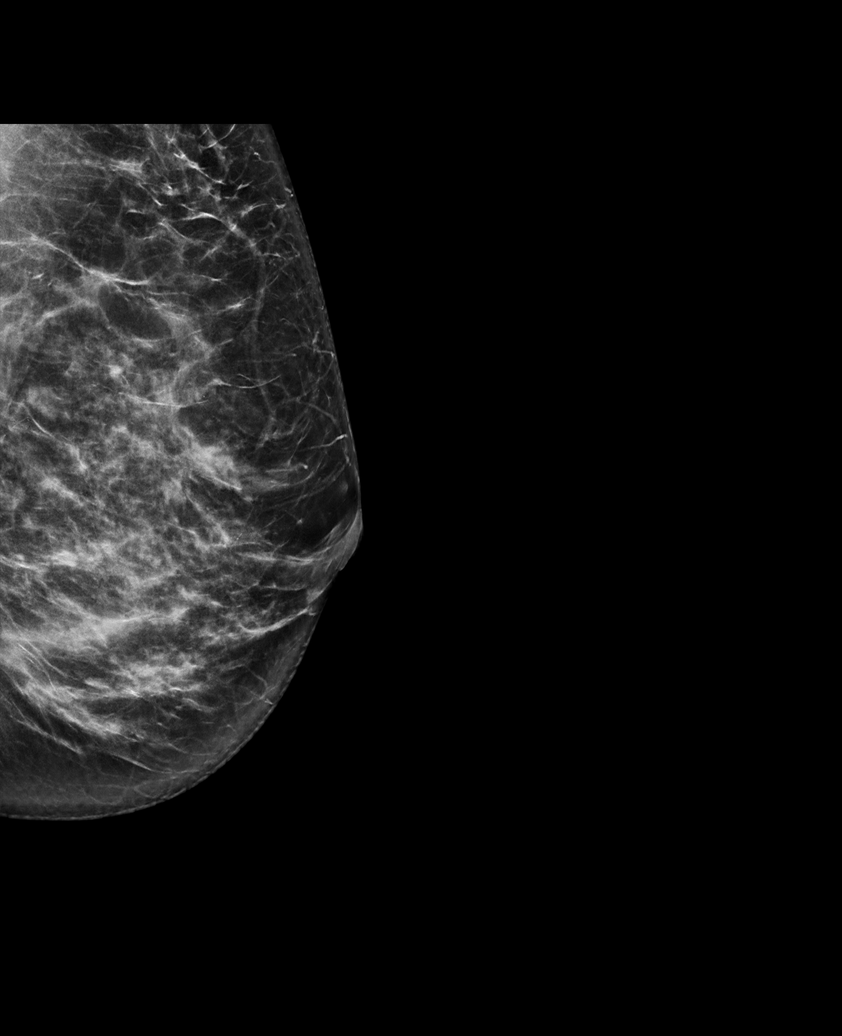

[L CC synth-2D (2 of 2)]
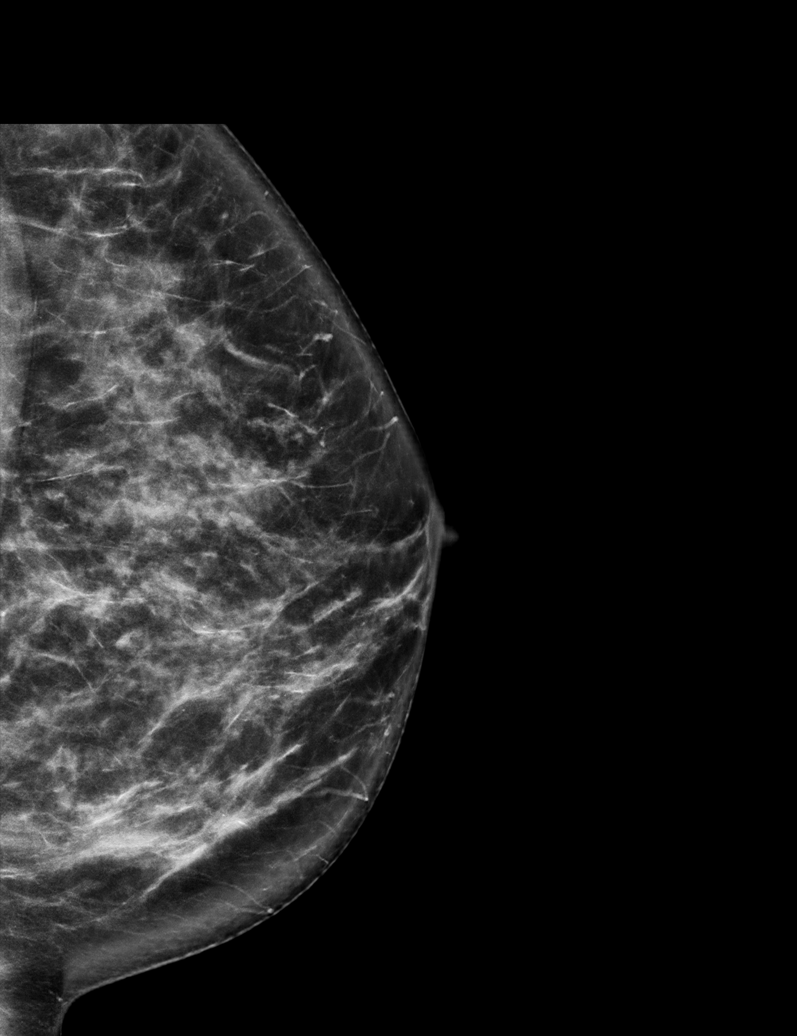

[L MLO synth-2D (3 of 3)]
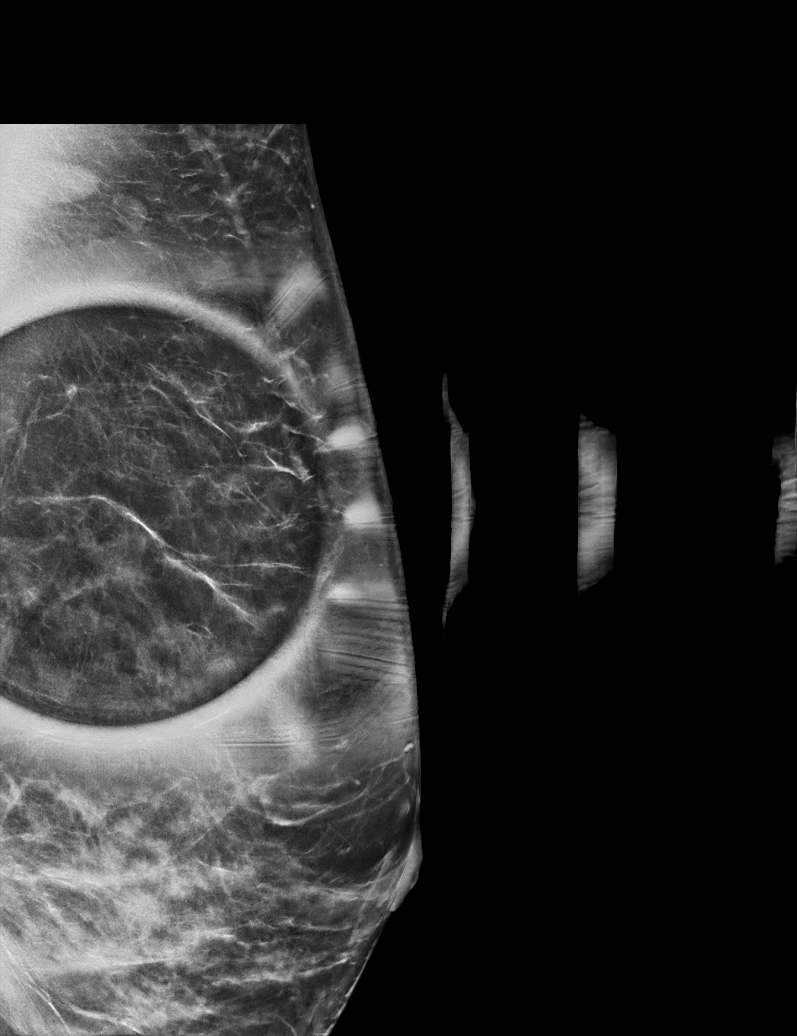

[L CC tomo · tomo slice 41/81.0]
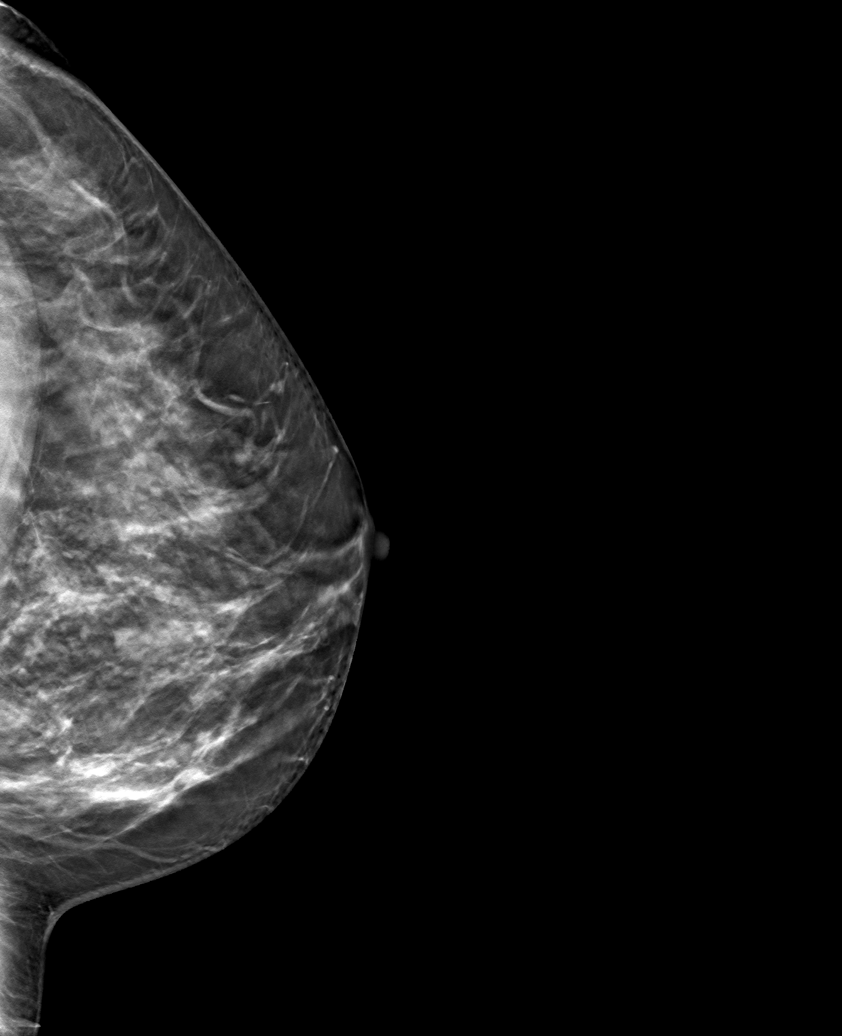

[6 of 30 positions shown; findings below may reference images not displayed]

ACR Breast Density Category c: The breast tissue is heterogeneously
dense, which may obscure small masses.
FINDINGS: The asymmetry in the upper inner quadrant of the left breast is
mammographically stable. No suspicious calcifications, masses or
areas of distortion are seen in the left breast.
IMPRESSION: Stable likely benign left breast asymmetry.

RECOMMENDATION:
Bilateral diagnostic mammogram in Wednesday August, 2021.

I have discussed the findings and recommendations with the patient.
If applicable, a reminder letter will be sent to the patient
regarding the next appointment.

BI-RADS CATEGORY  3: Probably benign.

## 2022-10-05 ENCOUNTER — Ambulatory Visit
Admission: RE | Admit: 2022-10-05 | Discharge: 2022-10-05 | Disposition: A | Discharge status: Home / Self Care | Payer: PRIVATE HEALTH INSURANCE | Source: Ambulatory Visit | Attending: Obstetrics and Gynecology | Admitting: Obstetrics and Gynecology

## 2022-10-05 DIAGNOSIS — N6489 Other specified disorders of breast: Secondary | ICD-10-CM

## 2023-10-04 DIAGNOSIS — Z6826 Body mass index (BMI) 26.0-26.9, adult: Secondary | ICD-10-CM | POA: Diagnosis not present

## 2023-10-04 DIAGNOSIS — Z01419 Encounter for gynecological examination (general) (routine) without abnormal findings: Secondary | ICD-10-CM | POA: Diagnosis not present

## 2023-10-08 DIAGNOSIS — H5213 Myopia, bilateral: Secondary | ICD-10-CM | POA: Diagnosis not present

## 2023-10-08 DIAGNOSIS — H524 Presbyopia: Secondary | ICD-10-CM | POA: Diagnosis not present

## 2023-11-01 DIAGNOSIS — Z131 Encounter for screening for diabetes mellitus: Secondary | ICD-10-CM | POA: Diagnosis not present

## 2023-11-01 DIAGNOSIS — Z13228 Encounter for screening for other metabolic disorders: Secondary | ICD-10-CM | POA: Diagnosis not present

## 2023-11-01 DIAGNOSIS — Z1321 Encounter for screening for nutritional disorder: Secondary | ICD-10-CM | POA: Diagnosis not present

## 2023-11-01 DIAGNOSIS — Z1231 Encounter for screening mammogram for malignant neoplasm of breast: Secondary | ICD-10-CM | POA: Diagnosis not present

## 2023-11-01 DIAGNOSIS — Z130 Encounter for screening for diseases of the blood and blood-forming organs and certain disorders involving the immune mechanism: Secondary | ICD-10-CM | POA: Diagnosis not present

## 2023-11-01 DIAGNOSIS — Z13220 Encounter for screening for lipoid disorders: Secondary | ICD-10-CM | POA: Diagnosis not present

## 2023-11-01 DIAGNOSIS — Z1329 Encounter for screening for other suspected endocrine disorder: Secondary | ICD-10-CM | POA: Diagnosis not present

## 2024-11-04 DIAGNOSIS — Z1231 Encounter for screening mammogram for malignant neoplasm of breast: Secondary | ICD-10-CM | POA: Diagnosis not present

## 2024-11-04 DIAGNOSIS — Z6824 Body mass index (BMI) 24.0-24.9, adult: Secondary | ICD-10-CM | POA: Diagnosis not present

## 2024-11-04 DIAGNOSIS — Z01419 Encounter for gynecological examination (general) (routine) without abnormal findings: Secondary | ICD-10-CM | POA: Diagnosis not present

## 2024-11-16 DIAGNOSIS — H5213 Myopia, bilateral: Secondary | ICD-10-CM | POA: Diagnosis not present

## 2024-11-26 DIAGNOSIS — Z1321 Encounter for screening for nutritional disorder: Secondary | ICD-10-CM | POA: Diagnosis not present

## 2024-11-26 DIAGNOSIS — Z13228 Encounter for screening for other metabolic disorders: Secondary | ICD-10-CM | POA: Diagnosis not present

## 2024-11-26 DIAGNOSIS — Z13220 Encounter for screening for lipoid disorders: Secondary | ICD-10-CM | POA: Diagnosis not present

## 2024-11-26 DIAGNOSIS — Z130 Encounter for screening for diseases of the blood and blood-forming organs and certain disorders involving the immune mechanism: Secondary | ICD-10-CM | POA: Diagnosis not present

## 2024-11-26 DIAGNOSIS — Z131 Encounter for screening for diabetes mellitus: Secondary | ICD-10-CM | POA: Diagnosis not present

## 2024-11-26 DIAGNOSIS — Z1329 Encounter for screening for other suspected endocrine disorder: Secondary | ICD-10-CM | POA: Diagnosis not present
